# Patient Record
Sex: Female | Born: 1990
Health system: Midwestern US, Community
[De-identification: ages and names within clinical notes are randomized; demographics above are authoritative.]

## PROBLEM LIST (undated history)

## (undated) DIAGNOSIS — B0053 Herpesviral conjunctivitis: Secondary | ICD-10-CM

## (undated) DIAGNOSIS — J45909 Unspecified asthma, uncomplicated: Secondary | ICD-10-CM

## (undated) DIAGNOSIS — O24419 Gestational diabetes mellitus in pregnancy, unspecified control: Secondary | ICD-10-CM

## (undated) HISTORY — DX: Gestational diabetes mellitus in pregnancy, unspecified control: O24.419

## (undated) HISTORY — PX: FRACTURE SURGERY: SHX138

---

## 2014-12-26 ENCOUNTER — Encounter (HOSPITAL_COMMUNITY): Payer: Self-pay | Admitting: *Deleted

## 2014-12-26 ENCOUNTER — Emergency Department (HOSPITAL_COMMUNITY)
Admission: EM | Admit: 2014-12-26 | Discharge: 2014-12-26 | Disposition: A | Discharge status: Home / Self Care | Payer: Medicaid - Out of State | Attending: Emergency Medicine | Admitting: Emergency Medicine

## 2014-12-26 DIAGNOSIS — R21 Rash and other nonspecific skin eruption: Secondary | ICD-10-CM | POA: Diagnosis present

## 2014-12-26 DIAGNOSIS — B009 Herpesviral infection, unspecified: Secondary | ICD-10-CM

## 2014-12-26 DIAGNOSIS — J45909 Unspecified asthma, uncomplicated: Secondary | ICD-10-CM | POA: Diagnosis not present

## 2014-12-26 DIAGNOSIS — B0239 Other herpes zoster eye disease: Secondary | ICD-10-CM | POA: Diagnosis not present

## 2014-12-26 HISTORY — DX: Unspecified asthma, uncomplicated: J45.909

## 2014-12-26 MED ORDER — VALACYCLOVIR HCL 1 G PO TABS: 1000.0000 mg | ORAL_TABLET | Freq: Three times a day (TID) | ORAL | Status: AC

## 2014-12-26 MED ORDER — VALACYCLOVIR HCL 500 MG PO TABS
1000.0000 mg | ORAL_TABLET | Freq: Once | ORAL | Status: AC
  Administered 2014-12-26: 1000 mg via ORAL
  Filled 2014-12-26: qty 2

## 2014-12-26 MED ORDER — FLUORESCEIN SODIUM 1 MG OP STRP
ORAL_STRIP | OPHTHALMIC | Status: AC
  Filled 2014-12-26: qty 1

## 2014-12-26 MED ORDER — FLUORESCEIN SODIUM 1 MG OP STRP
1.0000 | ORAL_STRIP | Freq: Once | OPHTHALMIC | Status: AC
  Administered 2014-12-26: 1 via OPHTHALMIC
  Filled 2014-12-26: qty 1

## 2014-12-26 NOTE — ED Notes (Signed)
Patient with rash to left eye, history of intermittent episodes of rash to eye. Patient states that she thinks she has herpes to the area. Patient reports blurred vision to left eye during episodes of rash to eye. No fevers. Patient has never seen provider for these symptoms. Patient states she never has rash in any other areas. Patient instructed note to wear contacts for one week and to follow up with optho as soon as possible. Patient verbalized importance of follow up.

## 2014-12-26 NOTE — Discharge Instructions (Signed)
Herpes Simplex Virus Follow-up with ophthalmology tomorrow. Return for any vision changes, eye pain, or spread of rash. Herpes simplex virus is a viral infection that may infect many different areas of the body, such as the genitalia and mouth. There are two different strains of the virus: herpes simplex virus 1 (HSV-1) and herpes simplex virus 2 (HSV-2). HSV-1 is typically associated with infections of the mouth and lips. HSV-2 is associated with infections of the genitals. However, either strain of the virus may infect any area. HSV may be spread through saliva particles or sexual contact. One unusual form of HSV-1, known as herpes gladiatorum, is passed from skin-to-skin contact, such as in wrestling. SYMPTOMS   Sometimes, no symptoms.  Fever.  Headache.  Muscle aches.  Tingling.  Itching.  Tenderness.  Genital burning feeling.  Genital pain.  Pain with urination.  Pain with sexual intercourse.  Small blisters in the affected areas. RISK FACTORS   Kissing an infected person.  Sharing eating utensils with an infected person.  Unprotected sexual activity.  Multiple sexual partners.  Direct contact sports without protective clothing.  Contact with an exposed herpes sore.  Stress, illness, and cold increase the risk of recurrence. PROGNOSIS  The primary outbreak of an HSV infection usually lasts 2 to 3 weeks. However, it has been known to last up to 6 weeks. After the primary outbreak subsides, the virus goes into a stage known as latency. During this time, there may be no physical symptoms of infection. After a period of time, some event, such as stress, cold, or illness will trigger another outbreak. This cycle of latency and outbreak may continue indefinitely. The outbreaks usually become milder over time. The body cannot rid itself of HSV. RELATED COMPLICATIONS   Recurrence.  Infection in other areas of the body, such as the eye (ocular herpetic infection,  keratitis) and rarely the brain (herpetic encephalitis). TREATMENT  Many HSV infections can be treated without medicine. During an outbreak, avoid touching the sores. Ice may be used to dull the pain and suppress the virus. Exposure to the sun is a common trigger for an outbreak, so the use of sunscreen may help in such cases. Avoid sexual contact during outbreaks. During the latent periods, it is advised that you use latex condoms, which will reduce the likelihood of spreading the virus to another person. Condoms made from animal products do not protect against HSV. Female condoms cover a larger area than female condoms, and may offer the most protection from the transmission of HSV. The presence of HSV will not affect a condom's ability to protect against pregnancy. Only take medicines for pain and discomfort if directed to do so by your caregiver. Many claims exist that certain dietary changes will prevent an outbreak, but these claims have not been proven. These claims include eating foods that are high in L-lysine and low in arginine (i.e. yogurt, beets, apples, pears, mangoes, oily fish (such as salmon, haddock, snapper, and swordfish), soybean sprouts, chicken, and tomatoes).  Athletes may return to play once they are showing no symptoms, and they have been treated.    This information is not intended to replace advice given to you by your health care provider. Make sure you discuss any questions you have with your health care provider.   Document Released: 01/13/2005 Document Revised: 04/07/2011 Document Reviewed: 08/02/2014 Elsevier Interactive Patient Education [employer]2016 Elsevier Inc.

## 2014-12-26 NOTE — ED Provider Notes (Signed)
CSN: [id number]     Arrival date & time 12/26/14  1458 History  By signing my name below, I, Emmanuella Mensah, attest that this documentation has been prepared under the direction and in the presence of [employer], PA-C. Electronically Signed: Angelene Giovanni, ED Scribe. 12/26/2014. 4:38 PM.    Chief Complaint  Patient presents with  . Rash  . Eye Problem   The history is provided by the patient. No language interpreter was used.   HPI Comments: Michelle [name] is a 24 y.o. female who presents to the Emergency Department complaining of a gradually worsening constant moderate left eye pain and swelling onset 3 days ago. She reports associated photophobia, intermittent blurred vision, and constant HA. She denies any fever or drainage from the eye. She states that she currently wears contact. She reports that her mother told her that her symptoms were consistent with herpes when she was younger. She adds that she has been having these symptoms intermittently since she was younger but does not remember the last time she was treated. No alleviating factors noted.  She denies any fever.   Past Medical History  Diagnosis Date  . Asthma    No past surgical history on file. No family history on file. Social History  Substance Use Topics  . Smoking status: Never Smoker   . Smokeless tobacco: Not on file  . Alcohol Use: Not on file   OB History    No data available     Review of Systems  Constitutional: Negative for fever.  Eyes: Positive for photophobia, pain and visual disturbance (intermittent blurred vision). Negative for discharge.  Neurological: Positive for headaches.      Allergies  Review of patient's allergies indicates not on file.  Home Medications   Prior to Admission medications   Medication Sig Start Date End Date Taking? Authorizing Provider  valACYclovir (VALTREX) 1000 MG tablet Take 1 tablet (1,000 mg total) by mouth 3 (three) times daily. 12/26/14  01/09/15  Kimra Kantor Patel-Mills, PA-C   BP 98/73 mmHg  Pulse 87  Temp(Src) 97.9 F (36.6 C) (Oral)  Resp 16  Ht  (1.626 m)  Wt 56.745 kg  BMI 21.46 kg/m2  SpO2 100% Physical Exam  Constitutional: She is oriented to person, place, and time. She appears well-developed and well-nourished. No distress.  HENT:  Head: Normocephalic and atraumatic.  Eyes: Conjunctivae and EOM are normal. Pupils are equal, round, and reactive to light.  Tender vasicular, erythematous lesions in a dermatomal pattern along the left eyelid and below the left eye.  EOMs intact PERRL Wearing contacts, no drainage from the eye or crusting from the eye lashes.   Wood's lamp with Fluorescein stain of left eye: No dendrites or foreign bodies. No Seidel sign. Normal conjunctiva.  Neck: Neck supple. No tracheal deviation present.  Cardiovascular: Normal rate.   Pulmonary/Chest: Effort normal. No respiratory distress.  Musculoskeletal: Normal range of motion.  Neurological: She is alert and oriented to person, place, and time.  Skin: Skin is warm and dry.  Psychiatric: She has a normal mood and affect. Her behavior is normal.  Nursing note and vitals reviewed.   ED Course  Procedures (including critical care time) DIAGNOSTIC STUDIES: Oxygen Saturation is 98% on RA, normal by my interpretation.    COORDINATION OF CARE: 4:25 PM- Pt advised of plan for treatment and pt agrees. Will consult with attending, Zadie Rhine, MD and pt will be placed on an Antiviral, Valacyclovir. Will also provide resources for a  follow up with Opthalmology. Advised to not wear contacts for one week until rash resolves and she has been seen by ophthalmology.     Visual Acuity  Right Eye Distance: 20/20 Left Eye Distance: 20/25 Bilateral Distance: 20/20  Right Eye Near:   Left Eye Near:    Bilateral Near:     MDM   Final diagnoses:  Herpes  Patient presents for rash over left eyelid. Her exam is concerning for herpes.  Her vital signs are stable and she is well-appearing. I believe that this does not involve her optic nerve. I did a fluorescein stain to look for dendrites but was negative. I spoke to Dr. Bebe ShaggyWickline who is seen and evaluated the patient and agrees with the plan. Patient was put on valacyclovir. I discussed following up with ophthalmology and gave her referral. Return precautions were thoroughly discussed. Patient verbally agrees with the plan.    Filed Vitals:   12/26/14 1521 12/26/14 1704  BP: 110/63 98/73  Pulse: 89 87  Temp: 98.7 F (37.1 C) 97.9 F (36.6 C)  Resp: 16 16   I personally performed the services described in this documentation, which was scribed in my presence. The recorded information has been reviewed and is accurate.   Catha GosselinHanna Patel-Mills, PA-C 12/26/14 1911  Zadie Rhineonald Wickline, MD 12/28/14 1125

## 2015-06-18 ENCOUNTER — Emergency Department (HOSPITAL_COMMUNITY): Payer: Medicaid - Out of State

## 2015-06-18 ENCOUNTER — Encounter (HOSPITAL_COMMUNITY): Payer: Self-pay | Admitting: *Deleted

## 2015-06-18 ENCOUNTER — Emergency Department (HOSPITAL_COMMUNITY)
Admission: EM | Admit: 2015-06-18 | Discharge: 2015-06-18 | Disposition: A | Discharge status: Home / Self Care | Payer: Medicaid - Out of State | Attending: Emergency Medicine | Admitting: Emergency Medicine

## 2015-06-18 DIAGNOSIS — J45909 Unspecified asthma, uncomplicated: Secondary | ICD-10-CM | POA: Insufficient documentation

## 2015-06-18 DIAGNOSIS — M546 Pain in thoracic spine: Secondary | ICD-10-CM | POA: Insufficient documentation

## 2015-06-18 LAB — URINALYSIS, ROUTINE W REFLEX MICROSCOPIC
BILIRUBIN URINE: NEGATIVE
Glucose, UA: NEGATIVE mg/dL
KETONES UR: 15 mg/dL — AB
LEUKOCYTES UA: NEGATIVE
NITRITE: NEGATIVE
Protein, ur: NEGATIVE mg/dL
Specific Gravity, Urine: 1.031 — ABNORMAL HIGH (ref 1.005–1.030)
pH: 5.5 (ref 5.0–8.0)

## 2015-06-18 LAB — I-STAT CHEM 8, ED
BUN: 7 mg/dL (ref 6–20)
Calcium, Ion: 1.21 mmol/L (ref 1.12–1.23)
Chloride: 102 mmol/L (ref 101–111)
Creatinine, Ser: 0.6 mg/dL (ref 0.44–1.00)
Glucose, Bld: 86 mg/dL (ref 65–99)
HCT: 43 % (ref 36.0–46.0)
Hemoglobin: 14.6 g/dL (ref 12.0–15.0)
Potassium: 3.6 mmol/L (ref 3.5–5.1)
Sodium: 138 mmol/L (ref 135–145)
TCO2: 22 mmol/L (ref 0–100)

## 2015-06-18 LAB — URINE MICROSCOPIC-ADD ON

## 2015-06-18 LAB — POC URINE PREG, ED: Preg Test, Ur: NEGATIVE

## 2015-06-18 LAB — D-DIMER, QUANTITATIVE (NOT AT ARMC): D DIMER QUANT: 0.27 ug{FEU}/mL (ref 0.00–0.50)

## 2015-06-18 MED ORDER — KETOROLAC TROMETHAMINE 60 MG/2ML IM SOLN
30.0000 mg | Freq: Once | INTRAMUSCULAR | Status: AC
  Administered 2015-06-18: 30 mg via INTRAMUSCULAR
  Filled 2015-06-18: qty 2

## 2015-06-18 MED ORDER — HYDROCODONE-ACETAMINOPHEN 5-325 MG PO TABS: 1.0000 | ORAL_TABLET | ORAL | Status: DC | PRN

## 2015-06-18 MED ORDER — METHOCARBAMOL 500 MG PO TABS: 500.0000 mg | ORAL_TABLET | Freq: Two times a day (BID) | ORAL | Status: DC

## 2015-06-18 MED ORDER — ACETAMINOPHEN 325 MG PO TABS
650.0000 mg | ORAL_TABLET | Freq: Once | ORAL | Status: DC
  Filled 2015-06-18: qty 2

## 2015-06-18 MED ORDER — FENTANYL CITRATE (PF) 100 MCG/2ML IJ SOLN
50.0000 ug | Freq: Once | INTRAMUSCULAR | Status: AC
  Administered 2015-06-18: 50 ug via INTRAVENOUS
  Filled 2015-06-18: qty 2

## 2015-06-18 MED ORDER — SODIUM CHLORIDE 0.9 % IV BOLUS (SEPSIS)
1000.0000 mL | Freq: Once | INTRAVENOUS | Status: AC
  Administered 2015-06-18: 1000 mL via INTRAVENOUS

## 2015-06-18 NOTE — ED Notes (Signed)
Pt reports left side back pain under her shoulder, reports its a stabbing pain that radiates around to her side. Denies any urinary symptoms or recent cough. No acute distress noted.

## 2015-06-18 NOTE — Discharge Instructions (Signed)
Take medications as prescribed. Return to the emergency room for worsening condition or new concerning symptoms. Follow up with your regular doctor. If you don't have a regular doctor use one of the numbers below to establish a primary care doctor. ° ° °Emergency Department Resource Guide °1) Find a Doctor and Pay Out of Pocket °Although you won't have to find out who is covered by your insurance plan, it is a good idea to ask around and get recommendations. You will then need to call the office and see if the doctor you have chosen will accept you as a new patient and what types of options they offer for patients who are self-pay. Some doctors offer discounts or will set up payment plans for their patients who do not have insurance, but you will need to ask so you aren't surprised when you get to your appointment. ° °2) Contact Your Local Health Department °Not all health departments have doctors that can see patients for sick visits, but many do, so it is worth a call to see if yours does. If you don't know where your local health department is, you can check in your phone book. The CDC also has a tool to help you locate your state's health department, and many state websites also have listings of all of their local health departments. ° °3) Find a Walk-in Clinic °If your illness is not likely to be very severe or complicated, you may want to try a walk in clinic. These are popping up all over the country in pharmacies, drugstores, and shopping centers. They're usually staffed by nurse practitioners or physician assistants that have been trained to treat common illnesses and complaints. They're usually fairly quick and inexpensive. However, if you have serious medical issues or chronic medical problems, these are probably not your best option. ° °No Primary Care Doctor: °- Call [facility] at  832-8000 - they can help you locate a primary care doctor that  accepts your insurance, provides certain services,  etc. °- Physician Referral Service- 1-800-533-3463 ° °Emergency Department Resource Guide °1) Find a Doctor and Pay Out of Pocket °Although you won't have to find out who is covered by your insurance plan, it is a good idea to ask around and get recommendations. You will then need to call the office and see if the doctor you have chosen will accept you as a new patient and what types of options they offer for patients who are self-pay. Some doctors offer discounts or will set up payment plans for their patients who do not have insurance, but you will need to ask so you aren't surprised when you get to your appointment. ° °2) Contact Your Local Health Department °Not all health departments have doctors that can see patients for sick visits, but many do, so it is worth a call to see if yours does. If you don't know where your local health department is, you can check in your phone book. The CDC also has a tool to help you locate your state's health department, and many state websites also have listings of all of their local health departments. ° °3) Find a Walk-in Clinic °If your illness is not likely to be very severe or complicated, you may want to try a walk in clinic. These are popping up all over the country in pharmacies, drugstores, and shopping centers. They're usually staffed by nurse practitioners or physician assistants that have been trained to treat common illnesses and complaints. They're usually fairly   quick and inexpensive. However, if you have serious medical issues or chronic medical problems, these are probably not your best option. ° °No Primary Care Doctor: °- Call [facility] at  832-8000 - they can help you locate a primary care doctor that  accepts your insurance, provides certain services, etc. °- Physician Referral Service- 1-800-533-3463 ° °Chronic Pain Problems: °Organization         Address  Phone   Notes  °[facility]  ([phone]) [phone] Patients need to be referred by  their primary care doctor.  ° °Medication Assistance: °Organization         Address  Phone   Notes  °[place] [facility] [address] °[place], [place] [address] ([phone]) 641-8030 --Must be a resident of [place] °-- Must have NO insurance coverage whatsoever (no Medicaid/ Medicare, etc.) °-- The pt. MUST have a primary care doctor that directs their care regularly and follows them in the community °  °[facility]  (866) 331-1348   °[employer]  (888) 892-1162   ° °Agencies that provide inexpensive medical care: °Organization         Address  Phone   Notes  °[facility]  ([phone]) [phone]   °[facility]    (336) 832-7272   °[facility] [address] °[place], [place] [address] (336) [phone]   °[facility] [address], °[place] (336) [phone]   °[facility]    (336) 373-0678   °[facility]    (336) 272-1050   °[facility] ° [address], [place] Phone:  (336) [phone], Fax:  (336) [phone] Hours of Operation:  9 am - 6 pm, M-F.  Also accepts Medicaid/Medicare and self-pay.  °[facility] ° [address], [place] Phone: (336) [phone], Fax: ([phone]) [phone]. Hours of Operation:  8:30 am - 5:30 pm, M-F.  Also accepts Medicaid and self-pay.  °[facility] [place] [address], [place] Phone: (336) [phone]   °[facility] [address], [place], [place] (336)723-1848, Ext. 123 Mondays & Thursdays: 7-9 AM.  First 15 patients are seen on a first come, first serve basis. °  ° °Medicaid-accepting [place] Providers: ° °Organization         Address  Phone   Notes  °[facility] [address], [place] ([phone]) [phone] Also accepts self-pay patients.  °[facility] [address], [place] ° (336) [phone]   °[facility]  [address], [place] (336) [phone]   °[facility] [address] [place] [address], [place] (336) [phone]   °[facility] [address], [place]  ° ([phone]) [phone] Only accepts [place] Access Medicaid patients after they have their name applied to their card.  ° °Self-Pay (no insurance) in [place]: ° °Organization         Address  Phone   Notes  °Sickle Cell Patients, [facility] [address], [place] (336) [phone]   °[facility] [address], [place] (336) [phone]   °[facility] ° [address], [place] (336) [phone]   °[facility]/Dr. Osei-Bonsu ° [address] [place] [address], [place] or [address], [place] [phone]) [phone] Phone number   for both [place] and [place] locations is the same.  °[facility] [address], [place] ([phone]) [phone]   °[facility] [address] [place] [address], [place] or [address] ([phone]) [phone] °(336) [phone]   °[facility] [address],  (336) 350-1642, phone; ([phone]) [phone], fax Sees patients 1st and 3rd Saturday of every month.  Must not qualify for public or private insurance (i.e. Medicaid, Medicare, [place] Health Choice, Veterans' Benefits) • Household income should be no more than 200% of the poverty level •The clinic cannot treat you if you are pregnant or think you are pregnant • Sexually transmitted diseases are not treated at the clinic.  ° ° ° °

## 2015-06-18 NOTE — ED Provider Notes (Signed)
CSN: [id number]     Arrival date & time 06/18/15  1211 History  By signing my name below, I, Octavia Heirrianna Nassar, attest that this documentation has been prepared under the direction and in the presence of Merilee Wible, [place]-C. Electronically Signed: Octavia HeirArianna Nassar, ED Scribe. 06/18/2015. 1:15 PM.    Chief Complaint  Patient presents with  . Back Pain  . Flank Pain      The history is provided by the patient.   HPI Comments: Michelle [name] is a 25 y.o. female who presents to the Emergency Department complaining of sudden onset, gradual worsening, constant, left sided upper back pain onset last night. She says the pain radiates around to the left side of her lower left chest. She notes she is feeling short of breath due to the increased pain. Pt says she has taken ibuprofen and tylenol to alleviate her pain with no relief. Denies nausea, vomiting, chest pain, urinary symptoms, hx of blood control. Pt is a smoker.   Past Medical History  Diagnosis Date  . Asthma    History reviewed. No pertinent past surgical history. History reviewed. No pertinent family history. Social History  Substance Use Topics  . Smoking status: Never Smoker   . Smokeless tobacco: None  . Alcohol Use: None   OB History    No data available     Review of Systems  A complete 10 system review of systems was obtained and all systems are negative except as noted in the HPI and PMH.    Allergies  Review of patient's allergies indicates no known allergies.  Home Medications   Prior to Admission medications   Not on File   Triage vitals: BP 126/75 mmHg  Pulse 110  Temp(Src) 98.6 F (37 C) (Oral)  Resp 20  SpO2 99%  LMP 05/28/2015 Physical Exam  Constitutional: She is oriented to person, place, and time. She appears well-developed and well-nourished.  HENT:  Head: Normocephalic.  Eyes: EOM are normal.  Neck: Normal range of motion.  Cardiovascular: Normal rate and regular rhythm.   Pulmonary/Chest: Effort  normal.  Heavy breathing due to pain. No tachypnea. Lungs CTAB. Anterior chest nontender.  Abdominal: Soft. Bowel sounds are normal. She exhibits no distension. There is no tenderness.  No CVA tenderness  Musculoskeletal: Normal range of motion.       Back:  Left thoracic area with diffuse ttp  Neurological: She is alert and oriented to person, place, and time.  Psychiatric: She has a normal mood and affect.  Nursing note and vitals reviewed.   ED Course  Procedures  DIAGNOSTIC STUDIES: Oxygen Saturation is 99% on RA, normal by my interpretation.  COORDINATION OF CARE:  1:15 PM Discussed treatment plan with pt at bedside and pt agreed to plan.  Labs Review Labs Reviewed  URINALYSIS, ROUTINE W REFLEX MICROSCOPIC (NOT AT [facility]) - Abnormal; Notable for the following:    APPearance HAZY (*)    Specific Gravity, Urine 1.031 (*)    Hgb urine dipstick MODERATE (*)    Ketones, ur 15 (*)    All other components within normal limits  URINE MICROSCOPIC-ADD ON - Abnormal; Notable for the following:    Squamous Epithelial / LPF 0-5 (*)    Bacteria, UA FEW (*)    All other components within normal limits  D-DIMER, QUANTITATIVE (NOT AT [facility])  POC URINE PREG, ED  I-STAT CHEM 8, ED    Imaging Review No results found. I have personally reviewed and evaluated these images  and lab results as part of my medical decision-making.   EKG Interpretation None      MDM   Final diagnoses:  Left-sided thoracic back pain    Pt with left thoracic back pain that radiates to underneath her left breast. She does have ttp inferior to her left scapula. However given her report of SOB and several risk factors along with tachycardia in the ED I will obtain d-dimer to r/o PE. UA obtained in triage. She denies urinary stymptoms but UA does reveal moderate hgb and ketones. Will hydrate with 1L NS and check chem 8 to check kidney function. She states she is not on her period, does not have sig history of  UTI or kidney stones. She has no CVA tenderness and her pain is higher up in her thorax so will hold off on CT for now.  Pain resolved with fluids and IV pain meds. Chem 8 is NL. D-dimer negative. I suspect muscular strain/spasm. Pt is a Horticulturist, commercial. Will give rx for norco and robaxin. She has NSAIDs at home. Instructed close pcp f/u. ER return precautions given.  I personally performed the services described in this documentation, which was scribed in my presence. The recorded information has been reviewed and is accurate.   Carlene Coria, PA-C 06/18/15 1540  Pricilla Loveless, MD 06/19/15 [phone]

## 2016-08-26 ENCOUNTER — Encounter (HOSPITAL_COMMUNITY): Payer: Self-pay | Admitting: Emergency Medicine

## 2016-08-26 ENCOUNTER — Emergency Department (HOSPITAL_COMMUNITY)
Admission: EM | Admit: 2016-08-26 | Discharge: 2016-08-26 | Disposition: A | Discharge status: Home / Self Care | Payer: Medicaid - Out of State | Attending: Emergency Medicine | Admitting: Emergency Medicine

## 2016-08-26 DIAGNOSIS — J45909 Unspecified asthma, uncomplicated: Secondary | ICD-10-CM | POA: Insufficient documentation

## 2016-08-26 DIAGNOSIS — B379 Candidiasis, unspecified: Secondary | ICD-10-CM | POA: Insufficient documentation

## 2016-08-26 DIAGNOSIS — B9689 Other specified bacterial agents as the cause of diseases classified elsewhere: Secondary | ICD-10-CM

## 2016-08-26 DIAGNOSIS — N760 Acute vaginitis: Secondary | ICD-10-CM | POA: Insufficient documentation

## 2016-08-26 DIAGNOSIS — Z79899 Other long term (current) drug therapy: Secondary | ICD-10-CM | POA: Insufficient documentation

## 2016-08-26 LAB — WET PREP, GENITAL
Sperm: NONE SEEN
Trich, Wet Prep: NONE SEEN

## 2016-08-26 LAB — URINALYSIS, ROUTINE W REFLEX MICROSCOPIC
Bilirubin Urine: NEGATIVE
Glucose, UA: NEGATIVE mg/dL
Hgb urine dipstick: NEGATIVE
Ketones, ur: NEGATIVE mg/dL
Leukocytes, UA: NEGATIVE
Nitrite: NEGATIVE
Protein, ur: NEGATIVE mg/dL
Specific Gravity, Urine: 1.012 (ref 1.005–1.030)
pH: 8 (ref 5.0–8.0)

## 2016-08-26 MED ORDER — METRONIDAZOLE 500 MG PO TABS: 500.0000 mg | ORAL_TABLET | Freq: Two times a day (BID) | ORAL | 0 refills | Status: DC

## 2016-08-26 MED ORDER — FLUCONAZOLE 150 MG PO TABS: 150.0000 mg | ORAL_TABLET | Freq: Every day | ORAL | 0 refills | Status: DC

## 2016-08-26 MED ORDER — FLUCONAZOLE 100 MG PO TABS
150.0000 mg | ORAL_TABLET | Freq: Every day | ORAL | Status: DC
  Administered 2016-08-26: 150 mg via ORAL
  Filled 2016-08-26: qty 2

## 2016-08-26 NOTE — Discharge Instructions (Signed)
Please read attached information. If you experience any new or worsening signs or symptoms please return to the emergency room for evaluation. Please follow-up with your primary care provider or specialist as discussed. Please use medication prescribed only as directed and discontinue taking if you have any concerning signs or symptoms.   °

## 2016-08-26 NOTE — ED Provider Notes (Signed)
[facility]-EMERGENCY DEPT Provider Note   CSN: [id number] Arrival date & time: 08/26/16  1342  By signing my name below, I, Michelle [name], attest that this documentation has been prepared under the direction and in the presence of H&R BlockJeffrey Mishika Flippen PA-C.  Electronically Signed: Vista Minkobert [name], ED Scribe. 08/26/16. 4:43 PM.   History   Chief Complaint Chief Complaint  Patient presents with  . Vaginitis    HPI  HPI Comments: Michelle [name] is a 26 y.o. female who presents to the Emergency Department complaining of persistent vaginal itching/irritation, dryness with associated vaginal discharge that started one week ago. Pt reports thick, white vaginal discharge. Pt notes that she has a new sexual partner and has not used protection. Pt is not taking birth control. She denies any chance that she could be pregnant. No fever, chills, nausea, vomiting. No abdominal pain.   The history is provided by the patient. No language interpreter was used.    Past Medical History:  Diagnosis Date  . Asthma     There are no active problems to display for this patient.   History reviewed. No pertinent surgical history.  OB History    No data available       Home Medications    Prior to Admission medications   Medication Sig Start Date End Date Taking? Authorizing Provider  fluconazole (DIFLUCAN) 150 MG tablet Take 1 tablet (150 mg total) by mouth daily. 08/26/16   Chavez Rosol, Tinnie GensJeffrey, PA-C  HYDROcodone-acetaminophen (NORCO/VICODIN) 5-325 MG tablet Take 1 tablet by mouth every 4 (four) hours as needed. 06/18/15   Sam, Ace GinsSerena Y, PA-C  methocarbamol (ROBAXIN) 500 MG tablet Take 1 tablet (500 mg total) by mouth 2 (two) times daily. 06/18/15   Sam, Ace GinsSerena Y, PA-C  metroNIDAZOLE (FLAGYL) 500 MG tablet Take 1 tablet (500 mg total) by mouth 2 (two) times daily. 08/26/16   Eyvonne MechanicHedges, Aisea Bouldin, PA-C    Family History No family history on file.  Social History Social History  Substance Use Topics  . Smoking  status: Never Smoker  . Smokeless tobacco: Never Used  . Alcohol use Yes     Allergies   Patient has no known allergies.   Review of Systems Review of Systems  Constitutional: Negative for chills and fever.  Gastrointestinal: Negative for abdominal pain, nausea and vomiting.  Genitourinary: Positive for vaginal discharge and vaginal pain.       Itching    Physical Exam Updated Vital Signs BP 109/62 (BP Location: Right Arm)   Pulse 86   Temp 98.3 F (36.8 C) (Oral)   Resp 16   Wt 127 lb (57.6 kg)   LMP 07/27/2016 (Approximate)   SpO2 100%   BMI 21.80 kg/m   Physical Exam  Constitutional: She is oriented to person, place, and time. She appears well-developed and well-nourished. No distress.  HENT:  Head: Normocephalic and atraumatic.  Neck: Normal range of motion.  Pulmonary/Chest: Effort normal.  Abdominal: Soft. She exhibits no distension. There is no tenderness.  Genitourinary:  Genitourinary Comments: No rash, purulent discharge, no vaginal bleeding, no cervical or adnexal tenderness  Neurological: She is alert and oriented to person, place, and time.  Skin: Skin is warm and dry. She is not diaphoretic.  Psychiatric: She has a normal mood and affect. Judgment normal.  Nursing note and vitals reviewed.  ED Treatments / Results  DIAGNOSTIC STUDIES: Oxygen Saturation is 100% on RA, normal by my interpretation.  COORDINATION OF CARE: 4:43 PM-Discussed treatment plan with pt at bedside and  pt agreed to plan.   Labs (all labs ordered are listed, but only abnormal results are displayed) Labs Reviewed  WET PREP, GENITAL - Abnormal; Notable for the following:       Result Value   Yeast Wet Prep HPF POC PRESENT (*)    Clue Cells Wet Prep HPF POC PRESENT (*)    WBC, Wet Prep HPF POC MODERATE (*)    All other components within normal limits  URINALYSIS, ROUTINE W REFLEX MICROSCOPIC - Abnormal; Notable for the following:    Color, Urine STRAW (*)    All other  components within normal limits  POC URINE PREG, ED  GC/CHLAMYDIA PROBE AMP ([place]) NOT AT [facility]    EKG  EKG Interpretation None       Radiology No results found.  Procedures Procedures (including critical care time)  Medications Ordered in ED Medications  fluconazole (DIFLUCAN) tablet 150 mg (not administered)     Initial Impression / Assessment and Plan / ED Course  I have reviewed the triage vital signs and the nursing notes.  Pertinent labs & imaging results that were available during my care of the patient were reviewed by me and considered in my medical decision making (see chart for details).      Final Clinical Impressions(s) / ED Diagnoses   Final diagnoses:  Acute vaginitis  Bacterial vaginosis  Yeast infection    26 year old female presents today with vaginitis.  Patient has bacterial vaginosis, yeast on wet prep here.  I have concern for other STDs.  She will be treated prophylactically after discussion with her.  Patient has no cervical motion tenderness or any other concerning signs or symptoms.  She will be treated, will follow up as an outpatient and return as needed.  She verbalized understanding and agreement to today's plan.  New Prescriptions New Prescriptions   FLUCONAZOLE (DIFLUCAN) 150 MG TABLET    Take 1 tablet (150 mg total) by mouth daily.   METRONIDAZOLE (FLAGYL) 500 MG TABLET    Take 1 tablet (500 mg total) by mouth 2 (two) times daily.   I personally performed the services described in this documentation, which was scribed in my presence. The recorded information has been reviewed and is accurate.   Eyvonne MechanicHedges, Asier Desroches, PA-C 08/26/16 1754    Mancel BaleWentz, Elliott, MD 08/28/16 [phone]

## 2016-08-26 NOTE — ED Notes (Signed)
Pelvic exam done by Jeff - PA and Conrado Nance - EMT assisted. 

## 2016-08-26 NOTE — ED Notes (Signed)
Pt given cranberry juice, per Trey PaulaJeff - PA.

## 2016-08-26 NOTE — ED Notes (Signed)
Pt used the bathroom before a urine sample could be collected. Informed Kendal HymenBonnie - RN.

## 2016-08-26 NOTE — ED Triage Notes (Signed)
Pt with vaginal itching, dryness, pain and redness. Some discharge that is white and thick. No fever. NAD.

## 2016-08-29 LAB — GC/CHLAMYDIA PROBE AMP (~~LOC~~) NOT AT ARMC
Chlamydia: NEGATIVE
Neisseria Gonorrhea: NEGATIVE

## 2017-03-28 IMAGING — CR DG RIBS W/ CHEST 3+V*L*
3 series · 3 of 3 positions shown · non-contrast
Comparison: None.

CLINICAL DATA: Posterior left rib pain and shortness of breath for
2 days. No acute injury.

EXAM:
LEFT RIBS AND CHEST - 3+ VIEW

[chest pa]
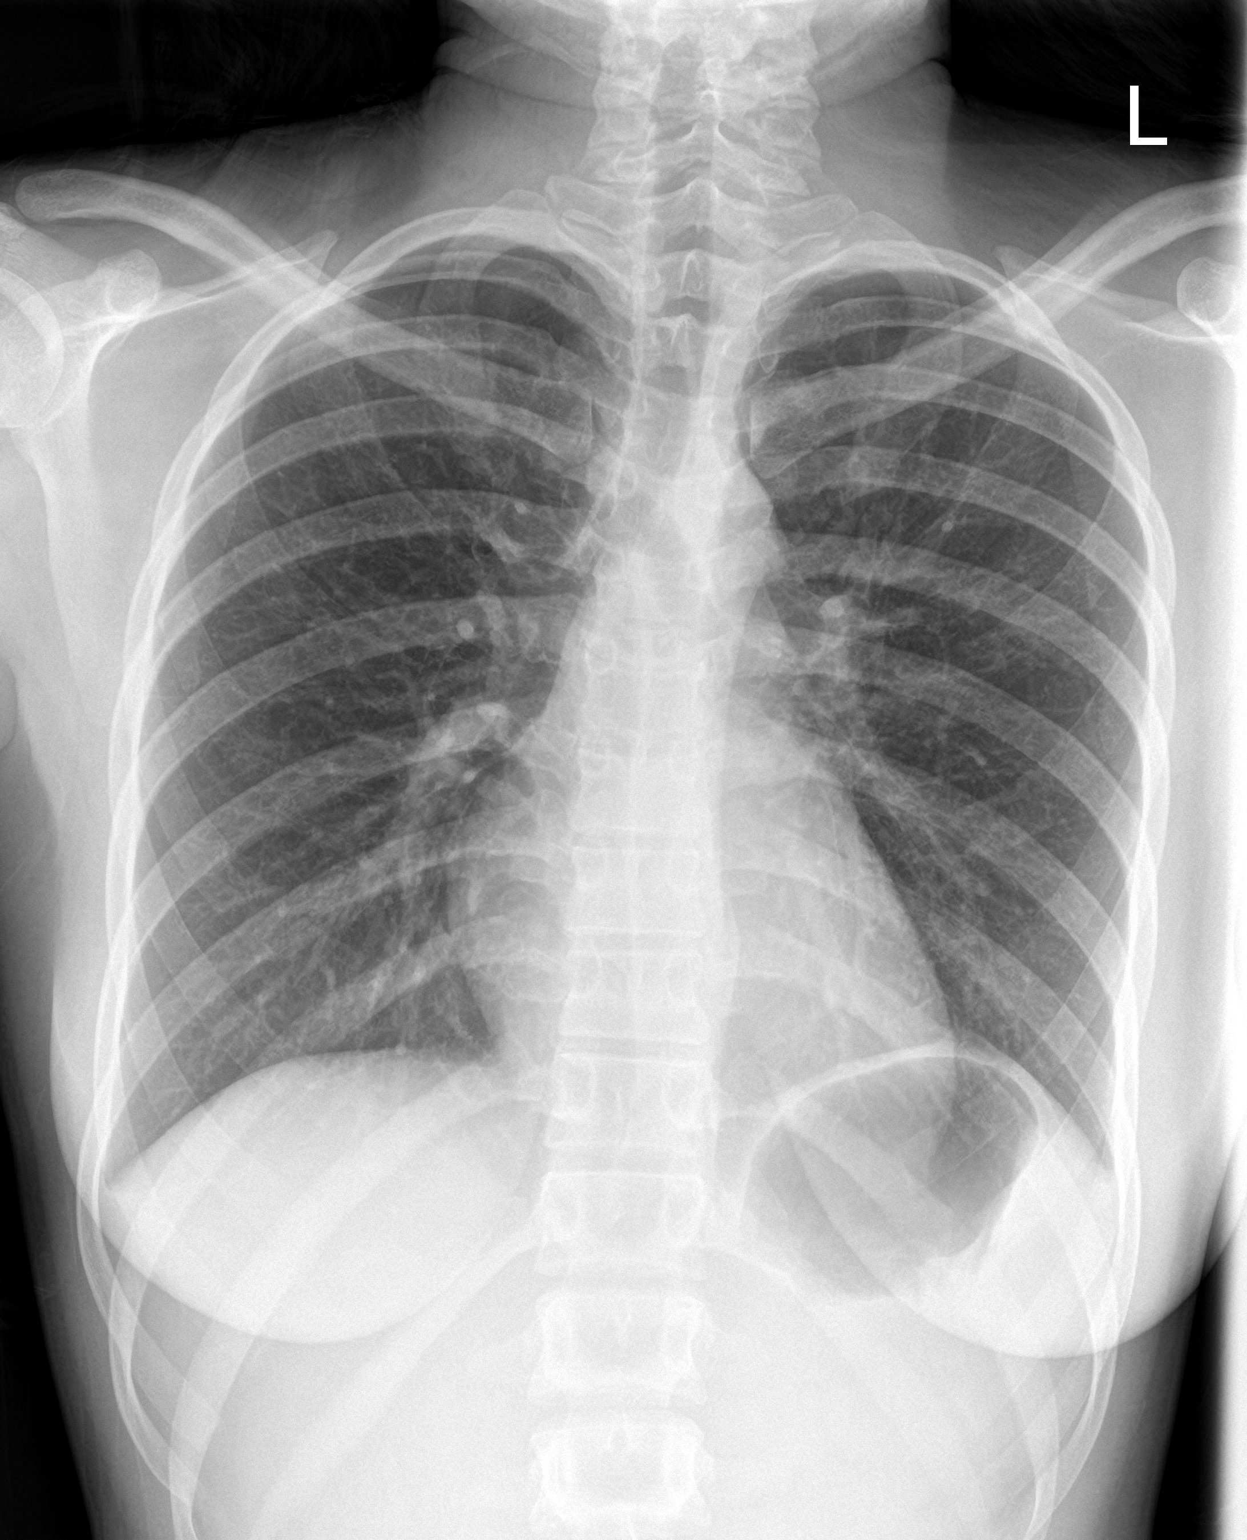

[rib ap]
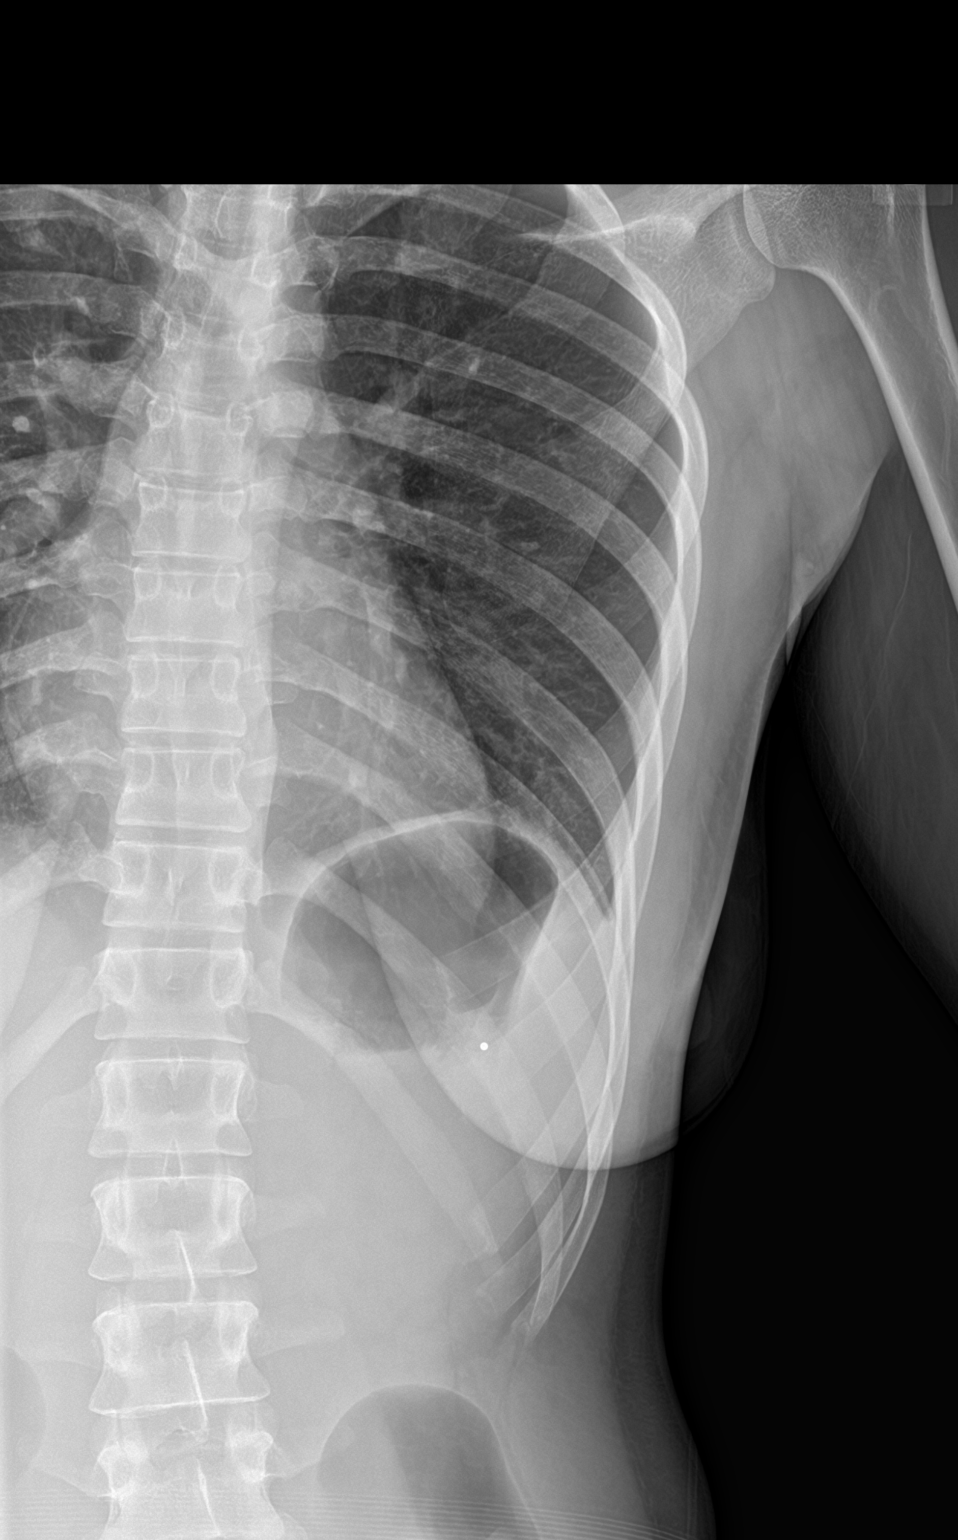

[rib ap obl]
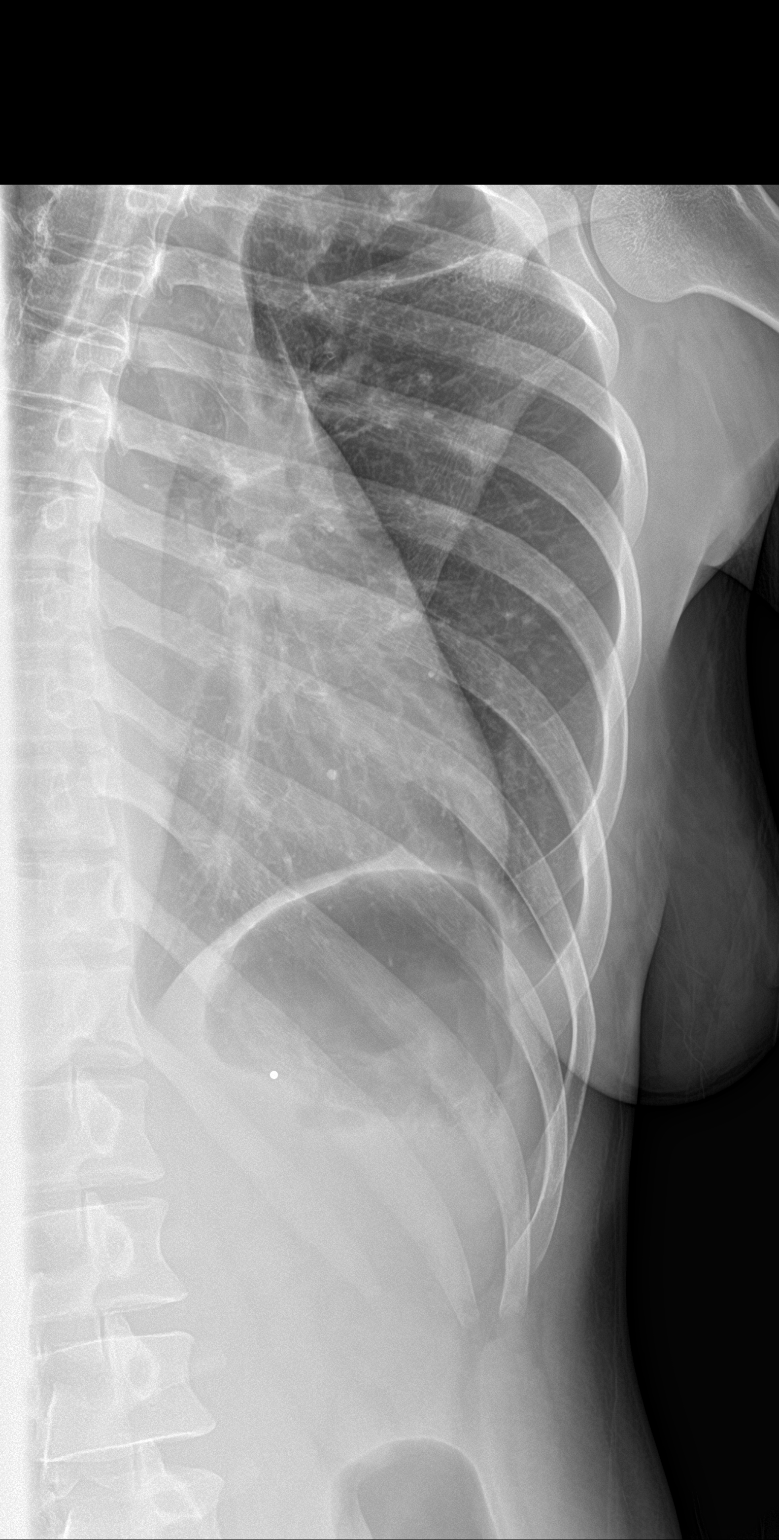

[3 of 3 positions shown; findings below may reference images not displayed]

FINDINGS: The heart size and mediastinal contours are normal. The lungs are
clear. There is no pleural effusion or pneumothorax. No acute
osseous findings are identified. A BB was placed over the area of
pain along the posterior inferior left chest wall. No underlying rib
lesion or fracture identified.
IMPRESSION: Negative. No evidence of rib fracture or active cardiopulmonary
process.

## 2018-10-03 ENCOUNTER — Encounter (HOSPITAL_COMMUNITY): Payer: Self-pay | Admitting: Emergency Medicine

## 2018-10-03 ENCOUNTER — Other Ambulatory Visit: Payer: Self-pay

## 2018-10-03 ENCOUNTER — Emergency Department (HOSPITAL_COMMUNITY)
Admission: EM | Admit: 2018-10-03 | Discharge: 2018-10-04 | Disposition: A | Discharge status: Home / Self Care | Payer: Medicaid - Out of State | Attending: Emergency Medicine | Admitting: Emergency Medicine

## 2018-10-03 DIAGNOSIS — B373 Candidiasis of vulva and vagina: Secondary | ICD-10-CM | POA: Insufficient documentation

## 2018-10-03 DIAGNOSIS — B9689 Other specified bacterial agents as the cause of diseases classified elsewhere: Secondary | ICD-10-CM | POA: Diagnosis not present

## 2018-10-03 DIAGNOSIS — N898 Other specified noninflammatory disorders of vagina: Secondary | ICD-10-CM | POA: Diagnosis present

## 2018-10-03 DIAGNOSIS — J45909 Unspecified asthma, uncomplicated: Secondary | ICD-10-CM | POA: Insufficient documentation

## 2018-10-03 DIAGNOSIS — N760 Acute vaginitis: Secondary | ICD-10-CM | POA: Diagnosis not present

## 2018-10-03 DIAGNOSIS — B3731 Acute candidiasis of vulva and vagina: Secondary | ICD-10-CM

## 2018-10-03 LAB — I-STAT BETA HCG BLOOD, ED (MC, WL, AP ONLY): I-stat hCG, quantitative: <5 m[IU]/mL (ref <5)

## 2018-10-03 NOTE — ED Triage Notes (Signed)
Pt to ED with c/o vaginal discharge and itching x's 3 days

## 2018-10-04 LAB — URINALYSIS, ROUTINE W REFLEX MICROSCOPIC
Bacteria, UA: NONE SEEN
Bilirubin Urine: NEGATIVE
Glucose, UA: NEGATIVE mg/dL
Ketones, ur: 80 mg/dL — AB
Nitrite: NEGATIVE
Protein, ur: 30 mg/dL — AB
Specific Gravity, Urine: 1.033 — ABNORMAL HIGH (ref 1.005–1.030)
pH: 5 (ref 5.0–8.0)

## 2018-10-04 LAB — RPR: RPR Ser Ql: NONREACTIVE

## 2018-10-04 LAB — WET PREP, GENITAL
Sperm: NONE SEEN
Trich, Wet Prep: NONE SEEN

## 2018-10-04 MED ORDER — METRONIDAZOLE 500 MG PO TABS: 500.0000 mg | ORAL_TABLET | Freq: Two times a day (BID) | ORAL | 0 refills | Status: DC

## 2018-10-04 MED ORDER — FLUCONAZOLE 150 MG PO TABS: ORAL_TABLET | ORAL | 0 refills | Status: DC

## 2018-10-04 NOTE — Discharge Instructions (Addendum)
Thank you for allowing me to care for you today in the Emergency Department.   You tested positive today for yeast and bacterial vaginosis on your pelvic exam.  These infections are not considered to be a sexually transmitted infection so you do not need to let sexual partners know that you tested positive.  -To treat bacterial vaginosis, take 1 tablet of metronidazole by mouth 2 times daily for the next week.  It is very important to avoid alcohol while you are taking this medication because it causes severe, projectile vomiting.  -To treat the yeast infection, take 1 tablet of Diflucan.  Unfortunately, you can get a recurrent yeast infection after taking antibiotics.  Since she will also be taking metronidazole for BV, I have given you a second tablet of Diflucan.  Take this in 1 week after you have finished all of the metronidazole tablets.  Your gonorrhea, chlamydia, HIV, and syphilis test are pending.  If you test positive, you will receive a call from the hospital with a number you gave registration today.  If you test positive for any of these test, it is your responsibility to let all sexual partners know that you tested positive so they can also seek testing and treatment.  You can download the app MyChart on your phone to review the results of all test once they are available.  The instructions are included on your discharge paperwork.  Download the app GoodRx to view the prices for the prescriptions at different pharmacies.  Return to the emergency department if you develop high fevers, severe abdominal pain, worsening vaginal discharge, back pain, persistent vomiting, or other new, concerning symptoms.

## 2018-10-04 NOTE — ED Provider Notes (Signed)
[facility] EMERGENCY DEPARTMENT Provider Note   CSN: [id number] Arrival date & time: 10/03/18  1812     History   Chief Complaint Chief Complaint  Patient presents with  . Vaginal Discharge  . Vaginal Itching    HPI Michelle [name] is a 28 y.o. female with a history of asthma who presents to the emergency department with a chief complaint of vaginal discharge.  The patient endorses thick, green-colored vaginal discharge for the last 3 days accompanied by vaginal itching and dysuria and dyspareunia.  She denies fever, chills, rashes, abdominal pain, nausea, vomiting, diarrhea, constipation, hematuria, urinary frequency or hesitancy, vaginal bleeding, back pain, or flank pain.  She reports that she has been sexually active with 2 individuals in the last few weeks.  She reports that she had oral sex with a female partner several days prior to the onset of symptoms. She does report that he did penetrate his vagina with his hand, but no penetration with his penis. No anal intercourse. No sexual toys used.  They did not use a condom.  She also reports that she was sexually active with a female partner approximately 2 weeks ago.  She reports that they used a strap-on for vaginal penetration as well as anticipated and oral-vaginal intercourse on both partners. She is unsure if the toy has been used with other partners. No anal intercourse. They did not use any form of barrier protection.    She reports that she took 3 tablets of metronidazole that she was given by her sister with no improvement in her symptoms.  No other treatment prior to arrival. LMP 09/20/18.      The history is provided by the patient. No language interpreter was used.  Vaginal Itching Pertinent negatives include no chest pain, no abdominal pain, no headaches and no shortness of breath.    Past Medical History:  Diagnosis Date  . Asthma     There are no active problems to display for this patient.    Past Surgical History:  Procedure Laterality Date  . FRACTURE SURGERY       OB History   No obstetric history on file.      Home Medications    Prior to Admission medications   Medication Sig Start Date End Date Taking? Authorizing Provider  fluconazole (DIFLUCAN) 150 MG tablet Take 1 tablet of Diflucan by mouth, then take 1 additional tablet of Diflucan by mouth 1 week later. 10/04/18   Chinedu Agustin A, PA-C  metroNIDAZOLE (FLAGYL) 500 MG tablet Take 1 tablet (500 mg total) by mouth 2 (two) times daily. 10/04/18   Marche Hottenstein A, PA-C    Family History No family history on file.  Social History Social History   Tobacco Use  . Smoking status: Never Smoker  . Smokeless tobacco: Never Used  Substance Use Topics  . Alcohol use: Yes  . Drug use: Yes    Types: Marijuana     Allergies   Patient has no known allergies.   Review of Systems Review of Systems  Constitutional: Negative for activity change, chills and fever.  Respiratory: Negative for shortness of breath.   Cardiovascular: Negative for chest pain.  Gastrointestinal: Negative for abdominal pain, anal bleeding, constipation, diarrhea, nausea and vomiting.  Genitourinary: Positive for dysuria, vaginal discharge and vaginal pain. Negative for enuresis, flank pain, genital sores, hematuria, menstrual problem and pelvic pain.  Musculoskeletal: Negative for back pain.  Skin: Negative for rash.  Allergic/Immunologic: Negative for immunocompromised state.  Neurological: Negative for weakness and headaches.  Psychiatric/Behavioral: Negative for confusion.   Physical Exam Updated Vital Signs BP 114/77 (BP Location: Left Arm)   Pulse 62   Temp 98.9 F (37.2 C) (Oral)   Resp 18   Ht 5\' 3"  (1.6 m)   Wt 55.8 kg   LMP 09/20/2018 (Exact Date)   SpO2 100%   BMI 21.79 kg/m   Physical Exam Vitals signs and nursing note reviewed.  Constitutional:      General: She is not in acute distress.    Appearance: She is  not ill-appearing, toxic-appearing or diaphoretic.  HENT:     Head: Normocephalic.  Eyes:     Conjunctiva/sclera: Conjunctivae normal.  Neck:     Musculoskeletal: Neck supple.  Cardiovascular:     Rate and Rhythm: Normal rate and regular rhythm.     Heart sounds: No murmur. No friction rub. No gallop.   Pulmonary:     Effort: Pulmonary effort is normal. No respiratory distress.  Abdominal:     General: There is no distension.     Palpations: Abdomen is soft. There is no mass.     Tenderness: There is no abdominal tenderness. There is no right CVA tenderness, left CVA tenderness, guarding or rebound.     Hernia: No hernia is present.  Genitourinary:    Comments: Chaperoned exam.  No cervical motion tenderness.  No adnexal tenderness or fullness bilaterally.  There is a large amount of thick, chunky white discharge noted in the vaginal vault. Skin:    General: Skin is warm.     Findings: No rash.  Neurological:     Mental Status: She is alert.  Psychiatric:        Behavior: Behavior normal.      ED Treatments / Results  Labs (all labs ordered are listed, but only abnormal results are displayed) Labs Reviewed  WET PREP, GENITAL - Abnormal; Notable for the following components:      Result Value   Yeast Wet Prep HPF POC PRESENT (*)    Clue Cells Wet Prep HPF POC PRESENT (*)    WBC, Wet Prep HPF POC MANY (*)    All other components within normal limits  URINALYSIS, ROUTINE W REFLEX MICROSCOPIC - Abnormal; Notable for the following components:   APPearance HAZY (*)    Specific Gravity, Urine 1.033 (*)    Hgb urine dipstick SMALL (*)    Ketones, ur 80 (*)    Protein, ur 30 (*)    Leukocytes,Ua SMALL (*)    All other components within normal limits  URINE CULTURE  RPR  HIV ANTIBODY (ROUTINE TESTING W REFLEX)  I-STAT BETA HCG BLOOD, ED ([facility], [facility], [facility] ONLY)  GC/CHLAMYDIA PROBE AMP ([place]) NOT AT [facility]    EKG None  Radiology No results found.  Procedures  Procedures (including critical care time)  Medications Ordered in ED Medications - No data to display   Initial Impression / Assessment and Plan / ED Course  I have reviewed the triage vital signs and the nursing notes.  Pertinent labs & imaging results that were available during my care of the patient were reviewed by me and considered in my medical decision making (see chart for details).        28 year old female presenting with vaginal discharge, itching, dyspareunia, and dysuria for the last 3 days.  She treated her symptoms with 3 tablets of metronidazole given to her by a family member prior to arrival.  She  has been sexually active with 2 partners over the last few weeks.  No constitutional symptoms.  Abdominal exam is benign.  She is hemodynamically stable.  Pregnancy test is negative.  Wet prep is positive for yeast and clue cells with many WBCs.  Urinalysis concerning for mild dehydration, but patient is tolerating p.o. fluids.  There is a minimal amount of leukocyte esterase, which could be secondary to vaginal etiology.Will send for urine culture. RPR, HIV, gonorrhea, and Chlamydia test are pending.  Doubt PID, pyelonephritis, or ovarian torsion.  Will discharge with 2 tablets of Diflucan- one to treat current yeast infection and a second tablet to take after she finishes course of metronidazole for BV.  We had a shared decision-making conversation regarding treatment for gonorrhea and chlamydia, which she defers treatment at this time.  Based on her exam, I think this is reasonable.  She is also been given a referral to the walk-in gynecology clinic.  She is also aware of pending labs and has been advised that if these are positive that she needs to let all sexual partners know.  She is hemodynamically stable in no acute distress.  Safe discharge home with outpatient follow-up as needed.  Final Clinical Impressions(s) / ED Diagnoses   Final diagnoses:  BV (bacterial vaginosis)   Vulvovaginal candidiasis    ED Discharge Orders         Ordered    fluconazole (DIFLUCAN) 150 MG tablet     10/04/18 0348    metroNIDAZOLE (FLAGYL) 500 MG tablet  2 times daily     10/04/18 0348           Pollyann Roa A, PA-C 10/04/18 0357    Glynn Octave, MD 10/04/18 0530

## 2018-10-05 LAB — HIV ANTIBODY (ROUTINE TESTING W REFLEX): HIV Screen 4th Generation wRfx: NONREACTIVE

## 2018-10-05 LAB — URINE CULTURE: Culture: 10000 — AB

## 2018-10-06 ENCOUNTER — Telehealth: Payer: Self-pay | Admitting: Emergency Medicine

## 2018-10-06 LAB — GC/CHLAMYDIA PROBE AMP (~~LOC~~) NOT AT ARMC
Chlamydia: NEGATIVE
Neisseria Gonorrhea: NEGATIVE

## 2018-10-06 NOTE — Telephone Encounter (Signed)
Post ED Visit - Positive Culture Follow-up  Culture report reviewed by antimicrobial stewardship pharmacist: [place] Team []  Elenor Quinones, Pharm.D. []  Heide Guile, Pharm.D., BCPS AQ-ID []  Parks Neptune, Pharm.D., BCPS []  Alycia Rossetti, Pharm.D., BCPS []  [place], [place].D., BCPS, AAHIVP []  Legrand Como, Pharm.D., BCPS, AAHIVP []  Salome Arnt, PharmD, BCPS []  Johnnette Gourd, PharmD, BCPS []  Hughes Better, PharmD, BCPS []  Leeroy Cha, PharmD []  Laqueta Linden, PharmD, BCPS []  Albertina Parr, PharmD Agnes Lawrence PharmD  [place] Team []  Leodis Sias, PharmD []  Lindell Spar, PharmD []  Royetta Asal, PharmD []  Graylin Shiver, Rph []  Rema Fendt) Glennon Mac, PharmD []  Arlyn Dunning, PharmD []  Netta Cedars, PharmD []  Dia Sitter, PharmD []  Leone Haven, PharmD []  Gretta Arab, PharmD []  Theodis Shove, PharmD []  Peggyann Juba, PharmD []  Reuel Boom, PharmD   Positive urine culture Treated with fluconazole and metronidazole, organism sensitive to the same and no further patient follow-up is required at this time.  Hazle Nordmann 10/06/2018, 1:21 PM

## 2018-12-24 ENCOUNTER — Emergency Department (HOSPITAL_COMMUNITY)
Admission: EM | Admit: 2018-12-24 | Discharge: 2018-12-24 | Disposition: A | Discharge status: Home / Self Care | Payer: Medicaid - Out of State | Attending: Emergency Medicine | Admitting: Emergency Medicine

## 2018-12-24 ENCOUNTER — Other Ambulatory Visit: Payer: Self-pay

## 2018-12-24 ENCOUNTER — Encounter (HOSPITAL_COMMUNITY): Payer: Self-pay | Admitting: Emergency Medicine

## 2018-12-24 DIAGNOSIS — J45909 Unspecified asthma, uncomplicated: Secondary | ICD-10-CM | POA: Diagnosis not present

## 2018-12-24 DIAGNOSIS — R109 Unspecified abdominal pain: Secondary | ICD-10-CM | POA: Insufficient documentation

## 2018-12-24 DIAGNOSIS — K5900 Constipation, unspecified: Secondary | ICD-10-CM | POA: Insufficient documentation

## 2018-12-24 DIAGNOSIS — F1210 Cannabis abuse, uncomplicated: Secondary | ICD-10-CM | POA: Insufficient documentation

## 2018-12-24 LAB — PREGNANCY, URINE: Preg Test, Ur: NEGATIVE

## 2018-12-24 LAB — URINALYSIS, ROUTINE W REFLEX MICROSCOPIC
Bilirubin Urine: NEGATIVE
Glucose, UA: NEGATIVE mg/dL
Ketones, ur: NEGATIVE mg/dL
Leukocytes,Ua: NEGATIVE
Nitrite: NEGATIVE
Protein, ur: NEGATIVE mg/dL
Specific Gravity, Urine: 1.002 — ABNORMAL LOW (ref 1.005–1.030)
pH: 6 (ref 5.0–8.0)

## 2018-12-24 MED ORDER — MAGNESIUM CITRATE PO SOLN: 1.0000 | Freq: Once | ORAL | 0 refills | Status: AC

## 2018-12-24 MED ORDER — POLYETHYLENE GLYCOL 3350 17 G PO PACK: 17.0000 g | PACK | Freq: Every day | ORAL | 3 refills | Status: DC

## 2018-12-24 NOTE — ED Triage Notes (Signed)
Pt arrives to ED with c/c of constipation for 5 days with LLQ pain. States she has tried otc stool softer with no relief.

## 2018-12-24 NOTE — Discharge Instructions (Addendum)
Return if any problems.

## 2018-12-24 NOTE — ED Provider Notes (Signed)
[facility] EMERGENCY DEPARTMENT Provider Note   CSN: [id number] Arrival date & time: 12/24/18  1455     History   Chief Complaint Chief Complaint  Patient presents with   Constipation    HPI Michelle [name] is a 28 y.o. female.     The history is provided by the patient. No language interpreter was used.  Constipation Severity:  Severe Time since last bowel movement:  5 days Timing:  Constant Progression:  Worsening Chronicity:  New Context: not dietary changes   Stool description:  None produced Unusual stool frequency:  Daily Relieved by:  Nothing Worsened by:  Nothing Ineffective treatments:  None tried Associated symptoms: abdominal pain    Pt reports she took a stool softner without relief  Past Medical History:  Diagnosis Date   Asthma     There are no active problems to display for this patient.   Past Surgical History:  Procedure Laterality Date   FRACTURE SURGERY       OB History   No obstetric history on file.      Home Medications    Prior to Admission medications   Not on File    Family History No family history on file.  Social History Social History   Tobacco Use   Smoking status: Never Smoker   Smokeless tobacco: Never Used  Substance Use Topics   Alcohol use: Yes   Drug use: Yes    Types: Marijuana     Allergies   Other   Review of Systems Review of Systems  Gastrointestinal: Positive for abdominal pain and constipation.  All other systems reviewed and are negative.    Physical Exam Updated Vital Signs BP 108/72 (BP Location: Left Arm)    Pulse 75    Temp 98.5 F (36.9 C) (Oral)    Resp 17    SpO2 100%   Physical Exam Vitals signs and nursing note reviewed.  Constitutional:      Appearance: She is well-developed.  HENT:     Head: Normocephalic.     Nose: Nose normal.  Eyes:     Pupils: Pupils are equal, round, and reactive to light.  Neck:     Musculoskeletal: Normal range  of motion.  Cardiovascular:     Rate and Rhythm: Normal rate.  Pulmonary:     Effort: Pulmonary effort is normal.  Abdominal:     General: There is no distension.  Musculoskeletal: Normal range of motion.  Skin:    General: Skin is warm.  Neurological:     General: No focal deficit present.     Mental Status: She is alert and oriented to person, place, and time.  Psychiatric:        Mood and Affect: Mood normal.      ED Treatments / Results  Labs (all labs ordered are listed, but only abnormal results are displayed) Labs Reviewed  URINALYSIS, ROUTINE W REFLEX MICROSCOPIC - Abnormal; Notable for the following components:      Result Value   Color, Urine COLORLESS (*)    Specific Gravity, Urine 1.002 (*)    Hgb urine dipstick SMALL (*)    Bacteria, UA RARE (*)    All other components within normal limits  PREGNANCY, URINE    EKG None  Radiology No results found.  Procedures Procedures (including critical care time)  Medications Ordered in ED Medications - No data to display   Initial Impression / Assessment and Plan / ED Course  I  have reviewed the triage vital signs and the nursing notes.  Pertinent labs & imaging results that were available during my care of the patient were reviewed by me and considered in my medical decision making (see chart for details).       MDM: ua and upt negative.  No evidence of acute abdomen.  Pt advised to follow p with her MD for recheck.  Mag citrate x 1 bottle, miralax daily   Final Clinical Impressions(s) / ED Diagnoses   Final diagnoses:  Constipation, unspecified constipation type    ED Discharge Orders         Ordered    magnesium citrate SOLN   Once     12/24/18 1742    polyethylene glycol (MIRALAX) 17 g packet  Daily     12/24/18 1742        An After Visit Summary was printed and given to the patient.    Elson Areas, [place] 12/24/18 1742    Gwyneth Sprout, MD 12/24/18 [phone]

## 2019-08-09 ENCOUNTER — Ambulatory Visit (HOSPITAL_COMMUNITY)
Admission: EM | Admit: 2019-08-09 | Discharge: 2019-08-09 | Disposition: A | Discharge status: Home / Self Care | Payer: Medicaid - Out of State | Attending: Emergency Medicine | Admitting: Emergency Medicine

## 2019-08-09 ENCOUNTER — Other Ambulatory Visit: Payer: Self-pay

## 2019-08-09 ENCOUNTER — Encounter (HOSPITAL_COMMUNITY): Payer: Self-pay

## 2019-08-09 DIAGNOSIS — H9209 Otalgia, unspecified ear: Secondary | ICD-10-CM | POA: Insufficient documentation

## 2019-08-09 DIAGNOSIS — Z20822 Contact with and (suspected) exposure to covid-19: Secondary | ICD-10-CM | POA: Insufficient documentation

## 2019-08-09 DIAGNOSIS — R05 Cough: Secondary | ICD-10-CM | POA: Diagnosis not present

## 2019-08-09 DIAGNOSIS — Z3202 Encounter for pregnancy test, result negative: Secondary | ICD-10-CM | POA: Diagnosis not present

## 2019-08-09 DIAGNOSIS — J029 Acute pharyngitis, unspecified: Secondary | ICD-10-CM | POA: Diagnosis not present

## 2019-08-09 DIAGNOSIS — J069 Acute upper respiratory infection, unspecified: Secondary | ICD-10-CM | POA: Diagnosis not present

## 2019-08-09 DIAGNOSIS — N898 Other specified noninflammatory disorders of vagina: Secondary | ICD-10-CM | POA: Diagnosis not present

## 2019-08-09 DIAGNOSIS — J45909 Unspecified asthma, uncomplicated: Secondary | ICD-10-CM | POA: Insufficient documentation

## 2019-08-09 LAB — SARS CORONAVIRUS 2 (TAT 6-24 HRS): SARS Coronavirus 2: NEGATIVE

## 2019-08-09 LAB — POCT URINALYSIS DIP (DEVICE)
Bilirubin Urine: NEGATIVE
Glucose, UA: NEGATIVE mg/dL
Ketones, ur: NEGATIVE mg/dL
Leukocytes,Ua: NEGATIVE
Nitrite: NEGATIVE
Protein, ur: NEGATIVE mg/dL
Specific Gravity, Urine: 1.025 (ref 1.005–1.030)
Urobilinogen, UA: 0.2 mg/dL (ref 0.0–1.0)
pH: 7 (ref 5.0–8.0)

## 2019-08-09 LAB — POC URINE PREG, ED: Preg Test, Ur: NEGATIVE

## 2019-08-09 MED ORDER — AMOXICILLIN-POT CLAVULANATE 875-125 MG PO TABS: 1.0000 | ORAL_TABLET | Freq: Two times a day (BID) | ORAL | 0 refills | Status: AC

## 2019-08-09 MED ORDER — METRONIDAZOLE 500 MG PO TABS: 500.0000 mg | ORAL_TABLET | Freq: Two times a day (BID) | ORAL | 0 refills | Status: AC

## 2019-08-09 MED ORDER — IBUPROFEN 800 MG PO TABS: 800.0000 mg | ORAL_TABLET | Freq: Three times a day (TID) | ORAL | 0 refills | Status: DC

## 2019-08-09 MED ORDER — CETIRIZINE HCL 10 MG PO CAPS: 10.0000 mg | ORAL_CAPSULE | Freq: Every day | ORAL | 0 refills | Status: DC

## 2019-08-09 NOTE — Discharge Instructions (Addendum)
Begin Augmentin twice daily for the next week to cover for sinus infection, this will also cover most throat infections Tylenol and ibuprofen for pain Begin daily cetirizine to help with postnasal drainage and mucus in throat-take consistently  Vaginal swab pending to screen for causes of discharge.  Urine did not show signs of UTI. Begin metronidazole twice daily over the next week to treat for bacterial vaginosis.  Please follow-up if any symptoms not improving or worsening

## 2019-08-09 NOTE — ED Triage Notes (Signed)
Pt c/o productive cough with yellow sputum, sore throat, right ear pain congestion for approx 1 week.   Also c/o vag discharge with yellow color and foul odor for approx 1 week. Also reports urinary frequency and urgency.  Denies abdominal pain, n/v/d, fever, chills.

## 2019-08-09 NOTE — ED Provider Notes (Signed)
[facility]    CSN: [id number] Arrival date & time: 08/09/19  1048      History   Chief Complaint Chief Complaint  Patient presents with   Cough   Vaginal Discharge    HPI Michelle [name] is a 29 y.o. female history of asthma presenting today for evaluation of sore throat and vaginal discharge.  Patient reports for the past 1.5 weeks she has had sore throat with associated postnasal drainage, mucus production and slight cough.  Has felt swollen lymph nodes in her neck.  Denies fevers.  Denies rhinorrhea.  Has had some ear pain.  Denies any close sick contacts.  Also concerned about vaginal discharge which she has had for the past 1 to 2 weeks.  Reports that is yellow in color with associated odor.  She has had associated urinary frequency urgency and incomplete voiding.  Denies dysuria.  Denies itching or irritation.  Last menstrual cycle was 07/02.  Is not on any form of birth control.  Is sexually active with females.  HPI  Past Medical History:  Diagnosis Date   Asthma     There are no problems to display for this patient.   Past Surgical History:  Procedure Laterality Date   FRACTURE SURGERY      OB History   No obstetric history on file.      Home Medications    Prior to Admission medications   Medication Sig Start Date End Date Taking? Authorizing Provider  amoxicillin-clavulanate (AUGMENTIN) 875-125 MG tablet Take 1 tablet by mouth every 12 (twelve) hours for 7 days. With food 08/09/19 08/16/19  Yenni Carra C, PA-C  Cetirizine HCl 10 MG CAPS Take 1 capsule (10 mg total) by mouth daily for 10 days. 08/09/19 08/19/19  Carolos Fecher C, PA-C  ibuprofen (ADVIL) 800 MG tablet Take 1 tablet (800 mg total) by mouth 3 (three) times daily. 08/09/19   Rj Pedrosa C, PA-C  metroNIDAZOLE (FLAGYL) 500 MG tablet Take 1 tablet (500 mg total) by mouth 2 (two) times daily for 7 days. 08/09/19 08/16/19  Lumina Gitto, Junius Creamer, PA-C    Family History Family  History  Problem Relation Age of Onset   Healthy Mother    Healthy Father     Social History Social History   Tobacco Use   Smoking status: Never Smoker   Smokeless tobacco: Never Used  Building services engineer Use: Never used  Substance Use Topics   Alcohol use: Yes   Drug use: Yes    Types: Marijuana     Allergies   Other   Review of Systems Review of Systems  Constitutional: Negative for activity change, appetite change, chills, fatigue and fever.  HENT: Positive for congestion and sore throat. Negative for ear pain, rhinorrhea, sinus pressure and trouble swallowing.   Eyes: Negative for discharge and redness.  Respiratory: Positive for cough. Negative for chest tightness and shortness of breath.   Cardiovascular: Negative for chest pain.  Gastrointestinal: Negative for abdominal pain, diarrhea, nausea and vomiting.  Genitourinary: Positive for frequency, urgency and vaginal discharge. Negative for dysuria, flank pain, genital sores, hematuria, menstrual problem, vaginal bleeding and vaginal pain.  Musculoskeletal: Negative for back pain and myalgias.  Skin: Negative for rash.  Neurological: Negative for dizziness, light-headedness and headaches.     Physical Exam Triage Vital Signs ED Triage Vitals  Enc Vitals Group     BP 08/09/19 1205 99/61     Pulse Rate 08/09/19 1205 62  Resp 08/09/19 1205 18     Temp 08/09/19 1205 98.4 F (36.9 C)     Temp Source 08/09/19 1205 Oral     SpO2 08/09/19 1205 99 %     Weight --      Height --      Head Circumference --      Peak Flow --      Pain Score 08/09/19 1203 6     Pain Loc --      Pain Edu? --      Excl. in GC? --    No data found.  Updated Vital Signs BP 99/61 (BP Location: Left Arm)    Pulse 62    Temp 98.4 F (36.9 C) (Oral)    Resp 18    LMP 07/29/2019    SpO2 99%   Visual Acuity Right Eye Distance:   Left Eye Distance:   Bilateral Distance:    Right Eye Near:   Left Eye Near:    Bilateral  Near:     Physical Exam Vitals and nursing note reviewed.  Constitutional:      Appearance: She is well-developed.     Comments: No acute distress  HENT:     Head: Normocephalic and atraumatic.     Ears:     Comments: Bilateral ears without tenderness to palpation of external auricle, tragus and mastoid, EAC's without erythema or swelling, TM's with good bony landmarks and cone of light. Non erythematous.     Nose: Nose normal.     Mouth/Throat:     Comments: Oral mucosa appears erythematous and slightly irritated to soft palate and uvula, posterior pharynx erythematous, no ulcers or lesions noted, no tonsillar enlargement or exudate Eyes:     Conjunctiva/sclera: Conjunctivae normal.  Neck:     Comments: Left tonsillar lymphadenopathy to anterior cervical chain Cardiovascular:     Rate and Rhythm: Normal rate.  Pulmonary:     Effort: Pulmonary effort is normal. No respiratory distress.     Comments: Breathing comfortably at rest, CTABL, no wheezing, rales or other adventitious sounds auscultated Abdominal:     General: There is no distension.  Musculoskeletal:        General: Normal range of motion.     Cervical back: Neck supple.  Skin:    General: Skin is warm and dry.  Neurological:     Mental Status: She is alert and oriented to person, place, and time.      UC Treatments / Results  Labs (all labs ordered are listed, but only abnormal results are displayed) Labs Reviewed  POCT URINALYSIS DIP (DEVICE) - Abnormal; Notable for the following components:      Result Value   Hgb urine dipstick SMALL (*)    All other components within normal limits  SARS CORONAVIRUS 2 (TAT 6-24 HRS)  POC URINE PREG, ED  CERVICOVAGINAL ANCILLARY ONLY    EKG   Radiology No results found.  Procedures Procedures (including critical care time)  Medications Ordered in UC Medications - No data to display  Initial Impression / Assessment and Plan / UC Course  I have reviewed the  triage vital signs and the nursing notes.  Pertinent labs & imaging results that were available during my care of the patient were reviewed by me and considered in my medical decision making (see chart for details).     1.  URI symptoms/sore throat-symptoms x1.5 weeks, will go ahead and cover with antibiotics to treat sinusitis, continue symptomatic and supportive  care as well.  Covid PCR pending.  2.  Vaginal discharge-high suspicion of bacterial vaginosis, but vaginal swab pending to screen for STDs.  Treating with metronidazole twice daily x1 week.  Will call with results and alter treatment as needed.  Discussed strict return precautions. Patient verbalized understanding and is agreeable with plan.  Final Clinical Impressions(s) / UC Diagnoses   Final diagnoses:  Sore throat  Vaginal discharge  Upper respiratory tract infection, unspecified type     Discharge Instructions     Begin Augmentin twice daily for the next week to cover for sinus infection, this will also cover most throat infections Tylenol and ibuprofen for pain Begin daily cetirizine to help with postnasal drainage and mucus in throat-take consistently  Vaginal swab pending to screen for causes of discharge.  Urine did not show signs of UTI. Begin metronidazole twice daily over the next week to treat for bacterial vaginosis.  Please follow-up if any symptoms not improving or worsening    ED Prescriptions    Medication Sig Dispense Auth. Provider   metroNIDAZOLE (FLAGYL) 500 MG tablet Take 1 tablet (500 mg total) by mouth 2 (two) times daily for 7 days. 14 tablet Chicquita Mendel C, PA-C   amoxicillin-clavulanate (AUGMENTIN) 875-125 MG tablet Take 1 tablet by mouth every 12 (twelve) hours for 7 days. With food 14 tablet Mija Effertz C, PA-C   ibuprofen (ADVIL) 800 MG tablet Take 1 tablet (800 mg total) by mouth 3 (three) times daily. 21 tablet Tashia Leiterman C, PA-C   Cetirizine HCl 10 MG CAPS Take 1 capsule  (10 mg total) by mouth daily for 10 days. 10 capsule Davon Abdelaziz, [place] C, PA-C     PDMP not reviewed this encounter.   Lew Dawes, [place] 08/09/19 1257

## 2019-08-10 LAB — CERVICOVAGINAL ANCILLARY ONLY
Bacterial Vaginitis (gardnerella): POSITIVE — AB
Candida Glabrata: NEGATIVE
Candida Vaginitis: NEGATIVE
Chlamydia: NEGATIVE
Comment: NEGATIVE
Comment: NEGATIVE
Comment: NEGATIVE
Comment: NEGATIVE
Comment: NEGATIVE
Comment: NORMAL
Neisseria Gonorrhea: NEGATIVE
Trichomonas: NEGATIVE

## 2019-11-16 ENCOUNTER — Ambulatory Visit (HOSPITAL_COMMUNITY)
Admission: EM | Admit: 2019-11-16 | Discharge: 2019-11-16 | Discharge status: Against Medical Advice | Payer: Medicaid - Out of State | Attending: Family Medicine | Admitting: Family Medicine

## 2019-11-16 ENCOUNTER — Other Ambulatory Visit: Payer: Self-pay

## 2019-11-16 NOTE — ED Notes (Signed)
Patient was called x 1 for triage.

## 2019-12-11 ENCOUNTER — Encounter (HOSPITAL_COMMUNITY): Payer: Self-pay | Admitting: Emergency Medicine

## 2019-12-11 ENCOUNTER — Other Ambulatory Visit: Payer: Self-pay

## 2019-12-11 ENCOUNTER — Ambulatory Visit (HOSPITAL_COMMUNITY)
Admission: EM | Admit: 2019-12-11 | Discharge: 2019-12-11 | Disposition: A | Discharge status: Home / Self Care | Payer: Medicaid - Out of State | Attending: Emergency Medicine | Admitting: Emergency Medicine

## 2019-12-11 DIAGNOSIS — Z113 Encounter for screening for infections with a predominantly sexual mode of transmission: Secondary | ICD-10-CM | POA: Insufficient documentation

## 2019-12-11 DIAGNOSIS — J4521 Mild intermittent asthma with (acute) exacerbation: Secondary | ICD-10-CM | POA: Insufficient documentation

## 2019-12-11 MED ORDER — ALBUTEROL SULFATE HFA 108 (90 BASE) MCG/ACT IN AERS
2.0000 | INHALATION_SPRAY | Freq: Once | RESPIRATORY_TRACT | Status: AC
  Administered 2019-12-11: 2 via RESPIRATORY_TRACT

## 2019-12-11 MED ORDER — METHYLPREDNISOLONE SODIUM SUCC 125 MG IJ SOLR
125.0000 mg | Freq: Once | INTRAMUSCULAR | Status: AC
  Administered 2019-12-11: 125 mg via INTRAMUSCULAR

## 2019-12-11 MED ORDER — PREDNISONE 20 MG PO TABS: 40.0000 mg | ORAL_TABLET | Freq: Every day | ORAL | 0 refills | Status: AC

## 2019-12-11 MED ORDER — ALBUTEROL SULFATE HFA 108 (90 BASE) MCG/ACT IN AERS: 1.0000 | INHALATION_SPRAY | Freq: Four times a day (QID) | RESPIRATORY_TRACT | 0 refills | Status: DC | PRN

## 2019-12-11 MED ORDER — CETIRIZINE HCL 10 MG PO CAPS: 10.0000 mg | ORAL_CAPSULE | Freq: Every day | ORAL | 0 refills | Status: DC

## 2019-12-11 MED ORDER — METHYLPREDNISOLONE SODIUM SUCC 125 MG IJ SOLR
INTRAMUSCULAR | Status: AC
  Filled 2019-12-11: qty 2

## 2019-12-11 MED ORDER — ALBUTEROL SULFATE HFA 108 (90 BASE) MCG/ACT IN AERS
INHALATION_SPRAY | RESPIRATORY_TRACT | Status: AC
  Filled 2019-12-11: qty 6.7

## 2019-12-11 MED ORDER — CETIRIZINE HCL 10 MG PO TABS: 10.0000 mg | ORAL_TABLET | Freq: Every day | ORAL | 0 refills | Status: DC

## 2019-12-11 NOTE — Discharge Instructions (Signed)
Take zyrtec daily.  Use of inhaler as needed for wheezing or shortness of breath.   5 days of prednisone.  We will notify of you any positive findings from your std screen, or if any changes to treatment are needed. If normal or otherwise without concern to your results, we will not call you. Please log on to your MyChart to review your results if interested in so.   Please return for any worsening of symptoms.  Please establish with a primary care provider for long term management of your asthma.

## 2019-12-11 NOTE — ED Notes (Signed)
Instructed patient to finish getting registered, deed[place], patient access notified of this as well.

## 2019-12-11 NOTE — ED Provider Notes (Signed)
[facility]    CSN: [id number] Arrival date & time: 12/11/19  1436      History   Chief Complaint Chief Complaint  Patient presents with  . Asthma  . STD Check    HPI Michelle [name] is a 29 y.o. female.   Michelle [name] presents with complaints of asthma exacerbation, wheezing, which started yesterday. She is out of her inhaler. Doesn't take regular allergy medication. No fevers. Shortness of breath. Also requesting std screen as she states that her girlfriend tested positive for chlamydia she was told. Some vaginal discharge. No vaginal bleeding. No pelvic pain.    ROS per HPI, negative if not otherwise mentioned.      Past Medical History:  Diagnosis Date  . Asthma     There are no problems to display for this patient.   Past Surgical History:  Procedure Laterality Date  . FRACTURE SURGERY      OB History   No obstetric history on file.      Home Medications    Prior to Admission medications   Medication Sig Start Date End Date Taking? Authorizing Provider  albuterol (PROAIR HFA) 108 (90 Base) MCG/ACT inhaler Inhale 1-2 puffs into the lungs every 6 (six) hours as needed for wheezing or shortness of breath. 12/11/19   Georgetta Haber, NP  cetirizine (ZYRTEC) 10 MG tablet Take 1 tablet (10 mg total) by mouth daily. 12/11/19   Georgetta Haber, NP  Cetirizine HCl 10 MG CAPS Take 1 capsule (10 mg total) by mouth daily. 12/11/19 01/10/20  Georgetta Haber, NP  ibuprofen (ADVIL) 800 MG tablet Take 1 tablet (800 mg total) by mouth 3 (three) times daily. 08/09/19   Wieters, Hallie C, PA-C  predniSONE (DELTASONE) 20 MG tablet Take 2 tablets (40 mg total) by mouth daily with breakfast for 5 days. 12/11/19 12/16/19  Georgetta Haber, NP    Family History Family History  Problem Relation Age of Onset  . Healthy Mother   . Healthy Father     Social History Social History   Tobacco Use  . Smoking status: Never Smoker  . Smokeless tobacco: Never  Used  Vaping Use  . Vaping Use: Never used  Substance Use Topics  . Alcohol use: Yes  . Drug use: Yes    Types: Marijuana     Allergies   Other   Review of Systems Review of Systems   Physical Exam Triage Vital Signs ED Triage Vitals  Enc Vitals Group     BP 12/11/19 1440 121/89     Pulse Rate 12/11/19 1440 97     Resp 12/11/19 1440 (!) 24     Temp 12/11/19 1440 98.1 F (36.7 C)     Temp Source 12/11/19 1440 Oral     SpO2 12/11/19 1440 100 %     Weight --      Height --      Head Circumference --      Peak Flow --      Pain Score 12/11/19 1444 7     Pain Loc --      Pain Edu? --      Excl. in GC? --    No data found.  Updated Vital Signs BP 121/89 (BP Location: Right Arm)   Pulse 97   Temp 98.1 F (36.7 C) (Oral)   Resp (!) 24   LMP 11/21/2019   SpO2 100%   Visual Acuity Right Eye Distance:   Left Eye  Distance:   Bilateral Distance:    Right Eye Near:   Left Eye Near:    Bilateral Near:     Physical Exam Constitutional:      General: She is not in acute distress.    Appearance: She is well-developed.  Cardiovascular:     Rate and Rhythm: Normal rate.  Pulmonary:     Effort: Pulmonary effort is normal. Tachypnea present.     Breath sounds: Wheezing present.  Abdominal:     Palpations: Abdomen is not rigid.     Tenderness: There is no abdominal tenderness. There is no guarding or rebound.  Genitourinary:    Comments: Denies sores, lesions, vaginal bleeding; no pelvic pain; gu exam deferred at this time, vaginal self swab collected.   Skin:    General: Skin is warm and dry.  Neurological:     Mental Status: She is alert and oriented to person, place, and time.      UC Treatments / Results  Labs (all labs ordered are listed, but only abnormal results are displayed) Labs Reviewed  RPR  HIV ANTIBODY (ROUTINE TESTING W REFLEX)  CERVICOVAGINAL ANCILLARY ONLY    EKG   Radiology No results found.  Procedures Procedures (including  critical care time)  Medications Ordered in UC Medications  albuterol (VENTOLIN HFA) 108 (90 Base) MCG/ACT inhaler 2 puff (has no administration in time range)  methylPREDNISolone sodium succinate (SOLU-MEDROL) 125 mg/2 mL injection 125 mg (has no administration in time range)    Initial Impression / Assessment and Plan / UC Course  I have reviewed the triage vital signs and the nursing notes.  Pertinent labs & imaging results that were available during my care of the patient were reviewed by me and considered in my medical decision making (see chart for details).     Solumedrol and inhaler provided in clinic today for asthma flair. Std screen collected and pending. Safe sex encouraged. Return precautions provided. Patient verbalized understanding and agreeable to plan.   Final Clinical Impressions(s) / UC Diagnoses   Final diagnoses:  Mild intermittent asthma with exacerbation  Screen for STD (sexually transmitted disease)     Discharge Instructions     Take zyrtec daily.  Use of inhaler as needed for wheezing or shortness of breath.   5 days of prednisone.  We will notify of you any positive findings from your std screen, or if any changes to treatment are needed. If normal or otherwise without concern to your results, we will not call you. Please log on to your MyChart to review your results if interested in so.   Please return for any worsening of symptoms.  Please establish with a primary care provider for long term management of your asthma.    ED Prescriptions    Medication Sig Dispense Auth. Provider   albuterol (PROAIR HFA) 108 (90 Base) MCG/ACT inhaler Inhale 1-2 puffs into the lungs every 6 (six) hours as needed for wheezing or shortness of breath. 1 each Georgetta Haber, NP   predniSONE (DELTASONE) 20 MG tablet Take 2 tablets (40 mg total) by mouth daily with breakfast for 5 days. 10 tablet Linus Mako B, NP   cetirizine (ZYRTEC) 10 MG tablet Take 1 tablet (10  mg total) by mouth daily. 30 tablet Linus Mako B, NP   Cetirizine HCl 10 MG CAPS Take 1 capsule (10 mg total) by mouth daily. 30 capsule Georgetta Haber, NP     PDMP not reviewed this encounter.  Georgetta Haber, NP 12/11/19 1541

## 2019-12-11 NOTE — ED Triage Notes (Signed)
patient has been having issues with asthma for one day ,  Does not have any inhaler, ran out.  Denies runny nose.  Does report a cough  Patient's first question for this nurse was can she get tested for everything, clarification meant STD

## 2019-12-12 LAB — CERVICOVAGINAL ANCILLARY ONLY
Bacterial Vaginitis (gardnerella): POSITIVE — AB
Candida Glabrata: NEGATIVE
Candida Vaginitis: POSITIVE — AB
Chlamydia: NEGATIVE
Comment: NEGATIVE
Comment: NEGATIVE
Comment: NEGATIVE
Comment: NEGATIVE
Comment: NEGATIVE
Comment: NORMAL
Neisseria Gonorrhea: NEGATIVE
Trichomonas: POSITIVE — AB

## 2019-12-12 LAB — RPR: RPR Ser Ql: NONREACTIVE

## 2019-12-12 LAB — HIV ANTIBODY (ROUTINE TESTING W REFLEX): HIV Screen 4th Generation wRfx: NONREACTIVE

## 2019-12-13 ENCOUNTER — Telehealth (HOSPITAL_COMMUNITY): Payer: Self-pay | Admitting: Emergency Medicine

## 2019-12-13 MED ORDER — FLUCONAZOLE 150 MG PO TABS: 150.0000 mg | ORAL_TABLET | Freq: Once | ORAL | 0 refills | Status: AC

## 2019-12-13 MED ORDER — METRONIDAZOLE 500 MG PO TABS: 500.0000 mg | ORAL_TABLET | Freq: Two times a day (BID) | ORAL | 0 refills | Status: DC

## 2020-05-03 ENCOUNTER — Ambulatory Visit (HOSPITAL_COMMUNITY): Payer: Self-pay

## 2020-05-14 ENCOUNTER — Ambulatory Visit (HOSPITAL_COMMUNITY)
Admission: EM | Admit: 2020-05-14 | Discharge: 2020-05-14 | Disposition: A | Discharge status: Home / Self Care | Payer: Medicaid - Out of State | Attending: Emergency Medicine | Admitting: Emergency Medicine

## 2020-05-14 ENCOUNTER — Other Ambulatory Visit: Payer: Self-pay

## 2020-05-14 ENCOUNTER — Ambulatory Visit: Payer: Medicaid - Out of State | Admitting: Internal Medicine

## 2020-05-14 ENCOUNTER — Encounter (HOSPITAL_COMMUNITY): Payer: Self-pay

## 2020-05-14 DIAGNOSIS — Z113 Encounter for screening for infections with a predominantly sexual mode of transmission: Secondary | ICD-10-CM | POA: Insufficient documentation

## 2020-05-14 DIAGNOSIS — N898 Other specified noninflammatory disorders of vagina: Secondary | ICD-10-CM

## 2020-05-14 LAB — POCT URINALYSIS DIPSTICK, ED / UC
Bilirubin Urine: NEGATIVE
Glucose, UA: NEGATIVE mg/dL
Ketones, ur: NEGATIVE mg/dL
Nitrite: NEGATIVE
Protein, ur: NEGATIVE mg/dL
Specific Gravity, Urine: 1.025 (ref 1.005–1.030)
Urobilinogen, UA: 0.2 mg/dL (ref 0.0–1.0)
pH: 5 (ref 5.0–8.0)

## 2020-05-14 NOTE — Discharge Instructions (Signed)
There is no obvious urinary tract infection to your urine so I have sent it to be cultured. We will notify of you any positive findings or if any changes to treatment are needed. If normal or otherwise without concern to your results, we will not call you. Please log on to your MyChart to review your results if interested in so.

## 2020-05-14 NOTE — ED Provider Notes (Signed)
[facility]    CSN: [id number] Arrival date & time: 05/14/20  1557      History   Chief Complaint Chief Complaint  Patient presents with  . Vaginal Discharge    HPI Aurelie Dicenzo is a 30 y.o. female.   [place] presents with complaints of vaginal discharge with odor which started around a week ago. Occasional pelvic cramping despite not being on her period. No fevers. Has had increased urination but no dysuria. Has had similar in the past related to UTI. She has one partner who has had other partners, therefore she has concern about std's and is interested in testing today. No back pain.     ROS per HPI, negative if not otherwise mentioned.      Past Medical History:  Diagnosis Date  . Asthma     There are no problems to display for this patient.   Past Surgical History:  Procedure Laterality Date  . FRACTURE SURGERY      OB History   No obstetric history on file.      Home Medications    Prior to Admission medications   Medication Sig Start Date End Date Taking? Authorizing Provider  albuterol (PROAIR HFA) 108 (90 Base) MCG/ACT inhaler Inhale 1-2 puffs into the lungs every 6 (six) hours as needed for wheezing or shortness of breath. 12/11/19   Georgetta Haber, NP  cetirizine (ZYRTEC) 10 MG tablet Take 1 tablet (10 mg total) by mouth daily. 12/11/19   Georgetta Haber, NP  Cetirizine HCl 10 MG CAPS Take 1 capsule (10 mg total) by mouth daily. 12/11/19 01/10/20  Georgetta Haber, NP  ibuprofen (ADVIL) 800 MG tablet Take 1 tablet (800 mg total) by mouth 3 (three) times daily. 08/09/19   Wieters, Hallie C, PA-C  metroNIDAZOLE (FLAGYL) 500 MG tablet Take 1 tablet (500 mg total) by mouth 2 (two) times daily. 12/13/19   LampteyBritta Mccreedy, MD    Family History Family History  Problem Relation Age of Onset  . Healthy Mother   . Healthy Father     Social History Social History   Tobacco Use  . Smoking status: Never Smoker  . Smokeless  tobacco: Never Used  Vaping Use  . Vaping Use: Never used  Substance Use Topics  . Alcohol use: Yes  . Drug use: Yes    Types: Marijuana     Allergies   Other   Review of Systems Review of Systems   Physical Exam Triage Vital Signs ED Triage Vitals  Enc Vitals Group     BP 05/14/20 1639 101/68     Pulse Rate 05/14/20 1639 60     Resp 05/14/20 1639 18     Temp 05/14/20 1639 98.9 F (37.2 C)     Temp src --      SpO2 05/14/20 1639 97 %     Weight --      Height --      Head Circumference --      Peak Flow --      Pain Score 05/14/20 1638 0     Pain Loc --      Pain Edu? --      Excl. in GC? --    No data found.  Updated Vital Signs BP 101/68   Pulse 60   Temp 98.9 F (37.2 C)   Resp 18   LMP 05/11/2020 (Exact Date)   SpO2 97%   Visual Acuity Right Eye Distance:  Left Eye Distance:   Bilateral Distance:    Right Eye Near:   Left Eye Near:    Bilateral Near:     Physical Exam Constitutional:      General: She is not in acute distress.    Appearance: She is well-developed.  Cardiovascular:     Rate and Rhythm: Normal rate.  Pulmonary:     Effort: Pulmonary effort is normal.  Abdominal:     Palpations: Abdomen is not rigid.     Tenderness: There is no abdominal tenderness. There is no guarding or rebound.  Genitourinary:    Comments: Denies sores, lesions, vaginal bleeding; no pelvic pain; gu exam deferred at this time, vaginal self swab collected.   Skin:    General: Skin is warm and dry.  Neurological:     Mental Status: She is alert and oriented to person, place, and time.      UC Treatments / Results  Labs (all labs ordered are listed, but only abnormal results are displayed) Labs Reviewed  POCT URINALYSIS DIPSTICK, ED / UC - Abnormal; Notable for the following components:      Result Value   Hgb urine dipstick SMALL (*)    Leukocytes,Ua TRACE (*)    All other components within normal limits  URINE CULTURE  CERVICOVAGINAL  ANCILLARY ONLY    EKG   Radiology No results found.  Procedures Procedures (including critical care time)  Medications Ordered in UC Medications - No data to display  Initial Impression / Assessment and Plan / UC Course  I have reviewed the triage vital signs and the nursing notes.  Pertinent labs & imaging results that were available during my care of the patient were reviewed by me and considered in my medical decision making (see chart for details).     Patient declines empiric std treatment today, will await final cytology results. Likely contaminated urine, culture pending to validate. Safe sex encouraged. Patient verbalized understanding and agreeable to plan.    Final Clinical Impressions(s) / UC Diagnoses   Final diagnoses:  Vaginal discharge  Screen for STD (sexually transmitted disease)     Discharge Instructions     There is no obvious urinary tract infection to your urine so I have sent it to be cultured. We will notify of you any positive findings or if any changes to treatment are needed. If normal or otherwise without concern to your results, we will not call you. Please log on to your MyChart to review your results if interested in so.      ED Prescriptions    None     PDMP not reviewed this encounter.   Georgetta Haber, NP 05/14/20 1734

## 2020-05-14 NOTE — ED Triage Notes (Addendum)
Pt in with c/o foul odor vaginal discharge x 1 week   Pt requesting refill on her inhaler

## 2020-05-15 LAB — CERVICOVAGINAL ANCILLARY ONLY
Bacterial Vaginitis (gardnerella): POSITIVE — AB
Candida Glabrata: NEGATIVE
Candida Vaginitis: NEGATIVE
Chlamydia: NEGATIVE
Comment: NEGATIVE
Comment: NEGATIVE
Comment: NEGATIVE
Comment: NEGATIVE
Comment: NEGATIVE
Comment: NORMAL
Neisseria Gonorrhea: NEGATIVE
Trichomonas: POSITIVE — AB

## 2020-05-16 ENCOUNTER — Telehealth (HOSPITAL_COMMUNITY): Payer: Self-pay | Admitting: Emergency Medicine

## 2020-05-16 LAB — URINE CULTURE: Culture: NO GROWTH

## 2020-05-16 MED ORDER — METRONIDAZOLE 500 MG PO TABS: 500.0000 mg | ORAL_TABLET | Freq: Two times a day (BID) | ORAL | 0 refills | Status: DC

## 2020-07-28 NOTE — Telephone Encounter (Signed)
Name of Caller: Julie Robles phone number: [phone]    Relationship to Patient: patient    Provider: Christophe Louis    Practice:  Urogynecology    Chief Complaint/Reason for Call: Pt sent form online to be seen for hyperandrogenic anovulation PCOS, kidney health, [facility] management. Please call to schedule.     Best time of day caller can be reached:        Patient advised that office/PCP has 24-48 business hours to return their call:

## 2020-07-31 NOTE — Telephone Encounter (Addendum)
Previous Urologist will send over Patient's pervious information. Will keep an eye out for records, 08/01/20, 08/02/20, 08/03/20, 08/04/20, 08/07/20, 08/08/20, 08/09/20, 08/10/20, 08/15/20, 08/16/20, 08/17/20, 08/20/20, 08/21/20, 08/22/20, 08/23/20, 08/24/20, 08/29/20

## 2020-08-01 NOTE — Telephone Encounter (Signed)
Name of Caller: Julie Robles    Relation to patient: patient    Contact Phone Number: [phone]    Appointment Scheduled with: Dr.Hom    Appointment Date & Time: 12/10/2020 at 8:20 AM    Reason for visit (are you having any symptoms) : wellness check and establish care    Transportation Issues/ concerns: n/a    Special Accommodations? ( wheel chair, etc) : n/a     Current medications: n/a    Any refills need?: n/a    Any chronic conditions you would like the physician to know of?: no

## 2020-08-30 NOTE — Telephone Encounter (Signed)
Called patient and lvm to call back regarding this referral.  Will also send letter in mail.

## 2020-09-03 NOTE — Telephone Encounter (Signed)
Patient was transferred to our office today after speaking with Urology. I advised the patient we do not treat for any of the issues she was having. Patient states she needs to have her annual examination as well and I gave her the number for [facility] office.

## 2020-10-29 ENCOUNTER — Encounter
Payer: PRIVATE HEALTH INSURANCE | Attending: Obstetrics | Primary: Student in an Organized Health Care Education/Training Program

## 2020-11-07 ENCOUNTER — Encounter
Payer: PRIVATE HEALTH INSURANCE | Attending: Surgery | Primary: Student in an Organized Health Care Education/Training Program

## 2020-11-14 ENCOUNTER — Encounter: Attending: Internal Medicine | Primary: Student in an Organized Health Care Education/Training Program

## 2020-11-22 ENCOUNTER — Ambulatory Visit
Admit: 2020-11-22 | Discharge: 2020-11-22 | Payer: PRIVATE HEALTH INSURANCE | Attending: Surgery | Primary: Student in an Organized Health Care Education/Training Program

## 2020-11-22 DIAGNOSIS — K219 Gastro-esophageal reflux disease without esophagitis: Secondary | ICD-10-CM

## 2020-11-22 NOTE — Progress Notes (Signed)
[place]  SURGICAL WEIGHT LOSS MANAGEMENT PROGRAM   PROGRESS NOTE INITIAL EVALUATION     Patient: Julie Robles    Date of Birth: [birth year]  Service Date: 11/22/20    Patient History:  The patient is a pleasant 30 y.o. year old female  with morbid obesity, who stands Height: 5' 2" (157.5 cm) (BCC HGT [place]) tall with a weight of Weight: 248 lb 9.6 oz (112.8 kg) , resulting in a BMI of Body mass index is 45.47 kg/m??.. She is interested in discussing weight loss surgery. The patient suffers from multiple co-morbidities as a result of morbid obesity, including:     PMHx:  Past Medical History:   Diagnosis Date    Anemia     Anesthesia complication     Anxiety     Arthritis     THUMBS    Daytime sleepiness     Depression     Fatigue     GERD (gastroesophageal reflux disease)     Heartburn     History of UTI     HTN (hypertension)     Hyperlipidemia     Hypothyroidism     Irregular menstrual cycle     Morbid obesity due to excess calories ([place]) 10/30/2020    Obesity     Pre-diabetes     Prediabetes     Snoring     SOBOE (shortness of breath on exertion)        Co-Morbidity Assessment  Co-Morbidity Initial Evaluation Today???s Visit   Type 2 DM Y    HTN Y    OSA N    GERD Y    Elevated CHO/Lipids Y    Depression Y      The patient denies  a history of myocardial infarction, deep vein thrombosis, pulmonary embolism, renal failure, hepatic failure, stroke, and seizure.    PSHx:  Past Surgical History:   Procedure Laterality Date    BONE GRAFT  2014    [facility]    FACIAL RECONSTRUCTION SURGERY  2005    DOG BITE- [facility]    WISDOM TOOTH EXTRACTION  2017       Social History:   This patient is alone for the evaluation today,  She does not smoke, and does not drink alcohol.    ROS:  General: negative for - chills, fatigue, fever or malaise  Ophthalmic: negative for - blurry vision, double vision or dry eyes  ENT: negative for - headaches, hearing change, nasal congestion, sinus pain or sore  throat  Endocrine: negative for - hot flashes, polydipsia/polyuria or skin changes  Respiratory: negative for - cough, shortness of breath or wheezing  Cardiovascular: negative for - chest pain, dyspnea on exertion, irregular heartbeat, murmur, rapid heart rate or shortness of breath  Gastrointestinal: negative for - change in bowel habits,  hemoptysis, hematemesis, hematochezia, dysphagia, nausea, vomiting, diarrhea, constipation, weight loss  Genito-Urinary: negative for - dysuria, hematuria, incontinence or nocturia  Musculoskeletal: negative for - joint pain, joint stiffness, joint swelling, muscle pain or muscular weakness  Neurological: negative for - confusion, dizziness, headaches or numbness/tingling    Physical Examination:   BP 124/88    Pulse 73    Temp 97.5 ??F (36.4 ??C)    Resp 16    Ht 5' 2" (1.575 m) Comment: BCC HGT [place]   Wt 248 lb 9.6 oz (112.8 kg)    BMI 45.47 kg/m??     General:  This patient is awake,  alert, and oriented, and is in no apparent distress.    Respiratory:  Non-labored breathing  Abdomen:  Obese, soft, non-tender, non-distended, without masses. No prior abdominal surgery and no scars are noted.   Head and Neck: Obese, normocephalic and atraumatic. Soft and supple.  Extremities:  No cyanosis, clubbing or edema/ No calf tenderness/No restrictions of movement, is ambulatory without assistance.  Neurological:  Intact x 4 extremities, no focal deficits notes.  Skin:   No rashes or lesions noted.  Rectal:   Not Done  Hernia:  no hernias found on exam      Assessment:      The patient has failed multiple attempts at non-surgical weight loss, and is now seeking surgical intervention to promote permanent and consistent weight loss. She  has chosen Laparoscopic Roux-en-Y Gastric Bypass.  She is well educated regarding it, as she has recently viewed our weight loss surgery informational seminar .     In anticipation of weight reductive surgery now or in the future, we spent a great deal of time  discussing the risks and benefits of, including but not limited to injury to intra-abdominal organs, breakdown of the gastric staple line, the need for re-operative therapy,  prolonged hospitalization,  mechanical ventilation,  and death. We discussed the possibility of bleeding, the need for blood transfusions, blood clots, hospital-acquired and intra-abdominal infection, anastomotic stricture, and worsening GERD.  And we discussed the need for post-operative visit compliance, behavior modifications and diet changes, protein and vitamin supplementation, as well as routine scheduled and dedicated exercise.  We discussed the potential weight loss benefit of approximately 60-70% of her excess body weight at 12-18 months post-op (LRYGB) and 50% of her excess body weight loss at 12-18 months after a LSG, as well as the possibility of insufficient weight loss or weight gain after 2 years post-operative time. Upon completion of all required pre-operative testing we will submit for insurance pre-authorization.     All female patients of childbearing age were advised to refrain from becoming pregnant for 12-18 months after weight loss surgery.  I advised them to discuss with their physician/gynecologist regarding contraception to prevent pregnancy during this period of time after surgery.  In addition, all patients were counseled on compliance with prescribed vitamin supplementation, office visit follow-up and compliance with program standards.    Plan:    Initial Pre-Operative Testing     Primary Procedure: Laparoscopic Roux-en-Y Gastric Bypass    Other Procedures: Laparoscopic liver biopsy    Initial Testing: Initial Pre-Surgical Lab Tests (CMP, TSH, Fasting Lipid Profile, Mg, Zinc, Vit B1 (whole blood), Vit B12, 25-OH Vit D, Fe, Ferritin, Folate), Ultrasound of the Abdomen, and Upper GI Endoscopy for GERD which has been untreated. Please also include HbA1c in the labs    Initial Consults: Psychological Evaluation and  Clearance, Bariatric Nutrition Assessment and Clearance, and Cardiology consult for clearance    Other Consultations: None    Pain Management Plan: None    Obtain prior medical records from:     Physician Supervised Diet and Exercise required by the patient???s insurance company: 3 months.      Pre-operative Optifast requirement: 2 weeks    Final Pre-Operative Testing    Screening CXR and EKG within 6 months of date of surgery including final Pre-Surgical Lab Tests  within 3 months of date of surgery (CBC, BMP)    Schedule final pre-operative office visit with surgeon, pre-operative education class, and pre-operative exercise class prior to date of surgery.  I met with her today to discuss risks and benefits of laparoscopic weight loss surgery, the risks and benefits are outlined as above and visual aids were used to describe the procedure.     I personally performed the evaluation and management of Blakley Michna in the development of a treatment plan for this patient. I personally interviewed the patient and performed an individual physical examination. In addition, I discussed the patient's condition and treatment options with them. I have also reviewed and agree with the past medical, family and social history unless otherwise noted. All of the patient's questions were answered.     I discussed/counseled the patient regarding the risks and benefits of surgery as well as the preoperative and postoperative care plan for this patient. The patient was seen and examined independently and relevant data reviewed by myself. A full chart review was performed. Due to the complexity of the patients condition as well as having at least one medical condition as listed above in medical history that is progressing despite medical management, this patient is considered high medical decision making and surgical intervention is necessary to help decrease the risk of continued chronic illness.     The patient was seen and  examined independently and relevant data reviewed by myself. A full chart review was performed.    Patient Care Team:  Consuello Bossier, MD as PCP - General (Family Medicine)  Michaell Cowing, MD as Consulting Physician (Internal Medicine)  Lindie Spruce, MD as Consulting Physician (General Surgery)  Consuello Bossier, MD (Family Medicine)  Velia Meyer, MD as Consulting Physician (General Surgery)    Electronically signed by Velia Meyer, MD on 11/22/2020 at 6:22 PM

## 2020-11-22 NOTE — Telephone Encounter (Signed)
Initial [facility] surgical patient Navigation & Financial Counseling Discussion     Patient Communication: In office     SURGEON:        []  JZ          []   AD     []   MP         []   TB          []   LM    PROCEDURE:   []  LRYGB  []   LSG   []  SADI-S   []  SADI                              []  UNDECIDED         []  REV:  SPECIFY:____________________    Confirmed pt wants to continue with surgical program/plan          []  YES   []  NO (complete program withdrawal note/process)     CO-MORBIDS:    []  NONE  []  DM  [] HTN  []  OSA  [] GERD  []  OTH:     PRIVATE PAY:    []  NO       [] YES     DATE OF INITIAL BENEFITS VERIFICATION:      TRANSFER FU:  []  YES   []  NO    PRIMARY INSURANCE: Payor: / Plan: [employer] PPO / Product Type: *No Product type* /   BENEFIT ON PLAN: []  NO  []  YES  BENEFIT MAX:  []  NO  []  YES  -- BENEFIT MAX: $  EMPLOYER:   DIET AND EXERCISE (DE) REQUIREMENT PRIMARY      []  NONE  [] 69M   []  85M  [] 366M    []  Medicare 4 Months  []  SPR (69M)    [] OTHER: _____    SECONDARY INSURANCE:   BENEFIT ON PLAN: []  NO  []  YES  BENEFIT MAX:         []  NO  []  YES  -- BENEFIT MAX: $_____  AUTH REQUIRED FROM SECONDARY    []   NO   []  YES  DIET AND EXERCISE REQUIREMENT SECONDARY      []  NONE     [] 69M  []  85M       []  Medicare 4 months   []  SPR (69M)    [] OTHER: ___    [x]  Discussed with patient:               Financial cost overview (document signed and pt given copy at new pt consult visit with surgeon),               Initial appointments: Bariatric Nutrition Assessment  (BNA)  & Diet and Exercise (DE)               Patient to look for yellow envelope in mail.  This yellow envelope will contain orders for labs, testing and required                clearances.                Pt encouraged to complete early in program to prevent delays.  Encourage blood work to be draw by 1st DE               appointment.   [x]   Reviewed OOP cost, including:   []  Optifast cost of approximately $130-140/week x weeks immediately prior to surgery - used to induce  rapid weight loss  which results in decrease in size of liver and therefore facilitates laparoscopically surgery approach.    [x] Overview of inpatient admission benefits - estimated inpatient co-pays, deductibles and/or co-insurance - Estimated OOP costs form reviewed with patient, and copy given to patient at new pt visit.   [x] Reviewed next steps with patient:     1) Scheduled at new pt surgeon visit:                  Registered Dietician (RD) for a Nutrition Assessment (BNA) and  Pre-operative Diet and Exercise (DE) appointment #1.                     [x] Patient reminded to arrive 15 minutes early for check in.  Late arrivals may need to be rescheduled.                2) Schedule:  Diet and Exercise Apt #2 only scheduled after initial BNA and DE completed,                3) Behavioral Health apt scheduled after DE started.   Reviewed rational and goal of Behavioral Health appointments.                4) [x] Reinforced need to cancel any WMI appointments 48 hours in advance.                      Cautioned NS/Same day cancellations may result in delay in program or program  completion hold.  Noted:  DE                     series needs to be a monthly series or insurance company may require repeat of the entire series.                 5)  [x] Smoker/tobacco products including vaping:  reviewed need for cessation before surgery clearance and life long                    abstinence after surgery for best outcomes.     Patient navigation to surgery:   [x]  Explained to patient that average time from initial consult to date of surgery can be 6-8 months.           - Process can take longer if there are cancelled appointments, delays in testing and/or additional clearances that needs to             be completed.            - Reviewed importance of patient active engagement in making and keeping appointments to keep the process moving.            - Reinforced need to cancel appointments at least 48 hours in advance.             **Reviewed that instances of No Shows and Same Day Appointment Cancellations may result in program/surgery delay               or hold.   [x]  Patient advised of importance of having voicemail and MyChart for office communications and lab/testing results before and after surgery.

## 2020-11-22 NOTE — Progress Notes (Signed)
[facility]  SURGICAL WEIGHT LOSS MANAGEMENT PROGRAM   PROGRESS NOTE - INITIAL CONSULTATION    Patient: Julie Robles   Date of Birth: [birth year]  Service Date: 11/22/20         Patient is here today to discuss the possibility of weight loss surgery. This patient is alone for the evaluation today    she is interested in discussing  WLS    Physician Supervised D/E: 3  Established Cardiologist: no  Established Pulmonary: no    Weight Metrics:  Date of Initial Consultation: Consult Date: 11/22/20    Initial Weight: Initial Weight: 248 lb 9.6 oz (112.8 kg)    Initial BMI: Initial BMI: 45.46    Ideal Body Weight: Ideal Body Weight: 125 lb (56.7 kg)    Excess Body Weight: 55 lbs    Waist Circumference: 15 in (add to Surgical Wt Management Flowsheet)  Neck Circumference: 15 in (add to Surgical Wt Management Flowsheet)    Falls Risk Assessment  Patient does take medications which affect BP or mental status   Patient does not have newly prescribed or changed dosage of medications within past 30 days which affect BP or mental status  Patient has not fallen in the past 2 months  Patient does not demonstrate unsteady gait  Patient uses the following ambulatory assistive devices: none  Patient states the presence of the following traits which increases risk of fall:    NA    none      Patient is low risk for falls. If high or moderate risk, patient instructed not to ambulate independently in the Center, and cord for call light placed within reach of patient.    Patient is not on home O2    Completed by:  Kerrin Champagne, LPN

## 2020-11-22 NOTE — Other (Unsigned)
Patient Acct Nbr: [id number]   Primary AUTH/CERT:   Primary Insurance Company Name: [employer]  Primary Insurance Plan name: Rosann Auerbach  Primary Insurance Group Number: [id number]  Primary Insurance Plan Type: Health  Primary Insurance Policy Number: [id number]

## 2020-11-28 ENCOUNTER — Other Ambulatory Visit: Payer: Self-pay

## 2020-11-28 ENCOUNTER — Encounter (HOSPITAL_COMMUNITY): Payer: Self-pay

## 2020-11-28 ENCOUNTER — Ambulatory Visit (HOSPITAL_COMMUNITY)
Admission: EM | Admit: 2020-11-28 | Discharge: 2020-11-28 | Disposition: A | Discharge status: Home / Self Care | Payer: BLUE CROSS/BLUE SHIELD | Attending: Emergency Medicine | Admitting: Emergency Medicine

## 2020-11-28 DIAGNOSIS — B009 Herpesviral infection, unspecified: Secondary | ICD-10-CM

## 2020-11-28 DIAGNOSIS — H5712 Ocular pain, left eye: Secondary | ICD-10-CM | POA: Diagnosis not present

## 2020-11-28 HISTORY — DX: Herpesviral conjunctivitis: B00.53

## 2020-11-28 MED ORDER — VALACYCLOVIR HCL 1 G PO TABS: 1000.0000 mg | ORAL_TABLET | Freq: Three times a day (TID) | ORAL | 0 refills | Status: AC

## 2020-11-28 NOTE — ED Provider Notes (Signed)
[facility]    CSN: [id number] Arrival date & time: 11/28/20  1325      History   Chief Complaint Chief Complaint  Patient presents with   Eye Problem    L    HPI Michelle [name] is a 30 y.o. female.   Patient here for evaluation of left eye redness, swelling, and pain that has been ongoing since yesterday.  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  Reports similar symptoms with herpes flares in the past.  Reports pain and irritation have gotten progressively worse since yesterday.  Reports watery discharge from left eye.  Denies any vision changes or photophobia.  Has not taken any OTC medications or treatments.  Denies any trauma, injury, or other precipitating event.  Denies any specific alleviating or aggravating factors.  Denies any fevers, chest pain, shortness of breath, N/V/D, numbness, tingling, weakness, abdominal pain, or headaches.    The history is provided by the patient.  Eye Problem Associated symptoms: discharge, itching and redness    Past Medical History:  Diagnosis Date   Asthma    Herpes simplex conjunctivitis of both eyes     There are no problems to display for this patient.   Past Surgical History:  Procedure Laterality Date   FRACTURE SURGERY      OB History   No obstetric history on file.      Home Medications    Prior to Admission medications   Medication Sig Start Date End Date Taking? Authorizing Provider  albuterol (PROAIR HFA) 108 (90 Base) MCG/ACT inhaler Inhale 1-2 puffs into the lungs every 6 (six) hours as needed for wheezing or shortness of breath. 12/11/19  Yes Burky, Dorene Grebe B, NP  valACYclovir (VALTREX) 1000 MG tablet Take 1 tablet (1,000 mg total) by mouth 3 (three) times daily for 7 days. 11/28/20 12/05/20 Yes Ivette Loyal, NP  cetirizine (ZYRTEC) 10 MG tablet Take 1 tablet (10 mg total) by mouth daily. 12/11/19   Georgetta Haber, NP  Cetirizine HCl 10 MG CAPS Take 1 capsule (10 mg total) by mouth  daily. 12/11/19 01/10/20  Georgetta Haber, NP  ibuprofen (ADVIL) 800 MG tablet Take 1 tablet (800 mg total) by mouth 3 (three) times daily. 08/09/19   Wieters, Hallie C, PA-C  metroNIDAZOLE (FLAGYL) 500 MG tablet Take 1 tablet (500 mg total) by mouth 2 (two) times daily. 05/16/20   Lamptey, Britta Mccreedy, MD    Family History Family History  Problem Relation Age of Onset   Healthy Mother    Healthy Father     Social History Social History   Tobacco Use   Smoking status: Never   Smokeless tobacco: Never  Vaping Use   Vaping Use: Never used  Substance Use Topics   Alcohol use: Yes   Drug use: Yes    Types: Marijuana     Allergies   Patient has no active allergies.   Review of Systems Review of Systems  Eyes:  Positive for discharge, redness and itching.  All other systems reviewed and are negative.   Physical Exam Triage Vital Signs ED Triage Vitals  Enc Vitals Group     BP 11/28/20 1556 (!) 137/54     Pulse Rate 11/28/20 1556 (!) 54     Resp 11/28/20 1556 18     Temp 11/28/20 1556 98.3 F (36.8 C)     Temp Source 11/28/20 1556 Oral     SpO2 11/28/20 1556 100 %  Weight --      Height --      Head Circumference --      Peak Flow --      Pain Score 11/28/20 1551 10     Pain Loc --      Pain Edu? --      Excl. in [place]? --    No data found.  Updated Vital Signs BP (!) 137/54 (BP Location: Left Arm)   Pulse (!) 54   Temp 98.3 F (36.8 C) (Oral)   Resp 18   LMP 10/27/2020   SpO2 100%   Visual Acuity Right Eye Distance:   Left Eye Distance:   Bilateral Distance:    Right Eye Near:   Left Eye Near:    Bilateral Near:     Physical Exam Vitals and nursing note reviewed.  Constitutional:      General: She is not in acute distress.    Appearance: Normal appearance. She is not ill-appearing, toxic-appearing or diaphoretic.  HENT:     Head: Normocephalic and atraumatic.  Eyes:     General: Lids are normal. Lids are everted, no foreign bodies  appreciated. Vision grossly intact. Gaze aligned appropriately. No allergic shiner or visual field deficit.       Left eye: Discharge present.No foreign body or hordeolum.     Extraocular Movements: Extraocular movements intact.     Conjunctiva/sclera:     Left eye: Left conjunctiva is injected.  Cardiovascular:     Rate and Rhythm: Normal rate.     Pulses: Normal pulses.  Pulmonary:     Effort: Pulmonary effort is normal.  Abdominal:     General: Abdomen is flat.  Musculoskeletal:        General: Normal range of motion.     Cervical back: Normal range of motion.  Skin:    General: Skin is warm and dry.  Neurological:     General: No focal deficit present.     Mental Status: She is alert and oriented to person, place, and time.  Psychiatric:        Mood and Affect: Mood normal.     UC Treatments / Results  Labs (all labs ordered are listed, but only abnormal results are displayed) Labs Reviewed - No data to display  EKG   Radiology No results found.  Procedures Procedures (including critical care time)  Medications Ordered in UC Medications - No data to display  Initial Impression / Assessment and Plan / UC Course  I have reviewed the triage vital signs and the nursing notes.  Pertinent labs & imaging results that were available during my care of the patient were reviewed by me and considered in my medical decision making (see chart for details).    Assessment negative for red flags or concerns.  Likely herpetic conjunctivitis of the left eye.  Will treat with valacyclovir 3 times a day for the next 7 days.  May take Tylenol and or ibuprofen as needed.  Follow-up for reevaluation with primary care. Final Clinical Impressions(s) / UC Diagnoses   Final diagnoses:  Herpes  Left eye pain     Discharge Instructions      Take the Valtrex three times a day for the next 7 days.   You can take Tylenol and/or Ibuprofen as needed for pain relief and fever reduction.    Return or go to the Emergency Department if symptoms worsen or do not improve in the next few days.  ED Prescriptions     Medication Sig Dispense Auth. Provider   valACYclovir (VALTREX) 1000 MG tablet Take 1 tablet (1,000 mg total) by mouth 3 (three) times daily for 7 days. 21 tablet Pearson Forster, NP      PDMP not reviewed this encounter.   Pearson Forster, NP 11/28/20 [phone]

## 2020-11-28 NOTE — Discharge Instructions (Signed)
Take the Valtrex three times a day for the next 7 days.   You can take Tylenol and/or Ibuprofen as needed for pain relief and fever reduction.   Return or go to the Emergency Department if symptoms worsen or do not improve in the next few days.

## 2020-11-28 NOTE — ED Triage Notes (Signed)
Pt presents with redness, itching of L eye starting yesterday.  States she has h/o herpes simplex in eye.

## 2020-12-03 ENCOUNTER — Encounter: Attending: Registered" | Primary: Student in an Organized Health Care Education/Training Program

## 2020-12-10 ENCOUNTER — Encounter: Attending: Internal Medicine | Primary: Student in an Organized Health Care Education/Training Program

## 2020-12-24 ENCOUNTER — Encounter
Payer: PRIVATE HEALTH INSURANCE | Attending: Obstetrics | Primary: Student in an Organized Health Care Education/Training Program

## 2020-12-24 ENCOUNTER — Encounter
Payer: PRIVATE HEALTH INSURANCE | Attending: Family | Primary: Student in an Organized Health Care Education/Training Program

## 2021-01-23 ENCOUNTER — Encounter (HOSPITAL_COMMUNITY): Payer: Self-pay | Admitting: *Deleted

## 2021-01-23 ENCOUNTER — Other Ambulatory Visit: Payer: Self-pay

## 2021-01-23 ENCOUNTER — Ambulatory Visit (HOSPITAL_COMMUNITY)
Admission: EM | Admit: 2021-01-23 | Discharge: 2021-01-23 | Disposition: A | Discharge status: Home / Self Care | Payer: BLUE CROSS/BLUE SHIELD | Attending: Urgent Care | Admitting: Urgent Care

## 2021-01-23 DIAGNOSIS — J45901 Unspecified asthma with (acute) exacerbation: Secondary | ICD-10-CM | POA: Diagnosis not present

## 2021-01-23 MED ORDER — ALBUTEROL SULFATE (2.5 MG/3ML) 0.083% IN NEBU
2.5000 mg | INHALATION_SOLUTION | Freq: Once | RESPIRATORY_TRACT | Status: AC
  Administered 2021-01-23: 16:00:00 2.5 mg via RESPIRATORY_TRACT

## 2021-01-23 MED ORDER — METHYLPREDNISOLONE SODIUM SUCC 125 MG IJ SOLR
80.0000 mg | Freq: Once | INTRAMUSCULAR | Status: AC
  Administered 2021-01-23: 18:00:00 80 mg via INTRAMUSCULAR

## 2021-01-23 MED ORDER — ALBUTEROL SULFATE (2.5 MG/3ML) 0.083% IN NEBU: 2.5000 mg | INHALATION_SOLUTION | Freq: Four times a day (QID) | RESPIRATORY_TRACT | 0 refills | Status: DC | PRN

## 2021-01-23 MED ORDER — ALBUTEROL SULFATE HFA 108 (90 BASE) MCG/ACT IN AERS: 1.0000 | INHALATION_SPRAY | Freq: Four times a day (QID) | RESPIRATORY_TRACT | 0 refills | Status: DC | PRN

## 2021-01-23 MED ORDER — METHYLPREDNISOLONE 4 MG PO TBPK: ORAL_TABLET | ORAL | 0 refills | Status: DC

## 2021-01-23 MED ORDER — METHYLPREDNISOLONE SODIUM SUCC 125 MG IJ SOLR
INTRAMUSCULAR | Status: AC
  Filled 2021-01-23: qty 2

## 2021-01-23 MED ORDER — ALBUTEROL SULFATE (2.5 MG/3ML) 0.083% IN NEBU
INHALATION_SOLUTION | RESPIRATORY_TRACT | Status: AC
  Filled 2021-01-23: qty 3

## 2021-01-23 NOTE — Discharge Instructions (Addendum)
Please go to ER or come back here if you get worse in the next 48-72 hours

## 2021-01-23 NOTE — ED Triage Notes (Signed)
Pt reports asthma flare up that started yesterday. Pt has used all of her 2 rescue inhalers and still has SHOB. SPO2 99% RA.

## 2021-01-23 NOTE — ED Provider Notes (Signed)
[facility]    CSN: [id number] Arrival date & time: 01/23/21  1549      History   Chief Complaint Chief Complaint  Patient presents with   Asthma    HPI Michelle [name] is a 30 y.o. female who presents due to having asthma flair since yesterday. She states the only thing she thinks that could be triggering it is the cold and dust from work. She is out of her rescue inhaler. Denies being ill with URI, having a fever, HA or aches. Had a neb treatment here as is feeling better.     Past Medical History:  Diagnosis Date   Asthma    Herpes simplex conjunctivitis of both eyes     There are no problems to display for this patient.   Past Surgical History:  Procedure Laterality Date   FRACTURE SURGERY      OB History   No obstetric history on file.      Home Medications    Prior to Admission medications   Medication Sig Start Date End Date Taking? Authorizing Provider  albuterol (PROVENTIL) (2.5 MG/3ML) 0.083% nebulizer solution Take 3 mLs (2.5 mg total) by nebulization every 6 (six) hours as needed for wheezing or shortness of breath. 01/23/21  Yes Rodriguez-Southworth, Nettie Elm, PA-C  methylPREDNISolone (MEDROL DOSEPAK) 4 MG TBPK tablet Take as directed 01/23/21  Yes Rodriguez-Southworth, Nettie Elm, PA-C  albuterol (PROAIR HFA) 108 (90 Base) MCG/ACT inhaler Inhale 1-2 puffs into the lungs every 6 (six) hours as needed for wheezing or shortness of breath. 01/23/21   Rodriguez-Southworth, Nettie Elm, PA-C  cetirizine (ZYRTEC) 10 MG tablet Take 1 tablet (10 mg total) by mouth daily. 12/11/19   Georgetta Haber, NP  Cetirizine HCl 10 MG CAPS Take 1 capsule (10 mg total) by mouth daily. 12/11/19 01/10/20  Georgetta Haber, NP    Family History Family History  Problem Relation Age of Onset   Healthy Mother    Healthy Father     Social History Social History   Tobacco Use   Smoking status: Never   Smokeless tobacco: Never  Vaping Use   Vaping Use: Never used   Substance Use Topics   Alcohol use: Yes   Drug use: Yes    Types: Marijuana     Allergies   Patient has no active allergies.   Review of Systems Review of Systems  Constitutional:  Negative for chills, diaphoresis and fever.  HENT:  Positive for postnasal drip. Negative for congestion, ear discharge, ear pain, rhinorrhea and sore throat.   Respiratory:  Positive for cough, shortness of breath and wheezing.   Musculoskeletal:  Negative for myalgias.  Skin:  Negative for rash.  Neurological:  Negative for headaches.    Physical Exam Triage Vital Signs ED Triage Vitals  Enc Vitals Group     BP --      Pulse Rate 01/23/21 1606 88     Resp 01/23/21 1606 (!) 22     Temp 01/23/21 1606 99.1 F (37.3 C)     Temp src --      SpO2 01/23/21 1604 99 %     Weight --      Height --      Head Circumference --      Peak Flow --      Pain Score 01/23/21 1604 0     Pain Loc --      Pain Edu? --      Excl. in [place]? --  No data found.  Updated Vital Signs Pulse 72    Temp 99.1 F (37.3 C)    Resp (!) 22    LMP 12/27/2020    SpO2 100%   Visual Acuity Right Eye Distance:   Left Eye Distance:   Bilateral Distance:    Right Eye Near:   Left Eye Near:    Bilateral Near:       Physical Exam Constitutional:      General: He is not in acute distress.    Appearance: He is not toxic-appearing.  HENT:     Head: Normocephalic.     Right Ear: external ear normal.     Left Ear: external ear normal.    Eyes:     General: No scleral icterus.    Conjunctiva/sclera: Conjunctivae normal.  Cardiovascular:     Rate and Rhythm: Normal rate and regular rhythm.     Heart sounds: No murmur heard.   Pulmonary:     Effort: Pulmonary effort is normal. No respiratory distress.     Breath sounds: Wheezing present throughout. 80% improved after Solumedrol IM injection.     Musculoskeletal:        General: Normal range of motion.     Cervical back: Neck supple.  Lymphadenopathy:      Cervical: No cervical adenopathy.  Skin:    General: Skin is warm and dry.     Findings: No rash.  Neurological:     Mental Status: She is alert and oriented to person, place, and time.     Gait: Gait normal.  Psychiatric:        Mood and Affect: Mood normal.        Behavior: Behavior normal.        Thought Content: Thought content normal.        Judgment: Judgment normal.   UC Treatments / Results  Labs (all labs ordered are listed, but only abnormal results are displayed) Labs Reviewed - No data to display  EKG   Radiology No results found.  Procedures Procedures (including critical care time)  Medications Ordered in UC Medications  albuterol (PROVENTIL) (2.5 MG/3ML) 0.083% nebulizer solution 2.5 mg (2.5 mg Nebulization Given 01/23/21 1617)  methylPREDNISolone sodium succinate (SOLU-MEDROL) 125 mg/2 mL injection 80 mg (80 mg Intramuscular Given 01/23/21 1829)    Initial Impression / Assessment and Plan / UC Course  I have reviewed the triage vital signs and the nursing notes. Asthma exacerbation She was given Solumedrol 80 mg IM here. She left with resolved SOB and improved wheezing.  I refilled her albuterol and placed her on Medrol which she should start tomorrow. See instructions    Final Clinical Impressions(s) / UC Diagnoses   Final diagnoses:  Severe asthma with acute exacerbation, unspecified whether persistent     Discharge Instructions      Please go to ER or come back here if you get worse in the next 48-72 hours      ED Prescriptions     Medication Sig Dispense Auth. Provider   albuterol (PROAIR HFA) 108 (90 Base) MCG/ACT inhaler Inhale 1-2 puffs into the lungs every 6 (six) hours as needed for wheezing or shortness of breath. 1 each Rodriguez-Southworth, Sunday Spillers, PA-C   albuterol (PROVENTIL) (2.5 MG/3ML) 0.083% nebulizer solution Take 3 mLs (2.5 mg total) by nebulization every 6 (six) hours as needed for wheezing or shortness of breath. 75 mL  Rodriguez-Southworth, Jozef Eisenbeis, PA-C   methylPREDNISolone (MEDROL DOSEPAK) 4 MG TBPK tablet Take as  directed 21 tablet Rodriguez-Southworth, Sunday Spillers, PA-C      PDMP not reviewed this encounter.   Shelby Mattocks, [place] 01/23/21 1853

## 2021-01-27 NOTE — L&D Delivery Note (Signed)
OB/GYN Faculty Practice Delivery Note  Michelle [name] is a 31 y.o. G6P0050 s/p SVD at [redacted]w[redacted]d. She was admitted for spontaneous onset of labor.   ROM: 6h 34m with clear fluid GBS Status: Positive, treated with PCN Maximum Maternal Temperature: 98.6  Labor Progress: Pt arrived to [facility] in active labor and progressed to 6cm, then AROM performed. She remained 7cm at which point her labor pattern indicated a need for pitocin augmentation and then she progressed to complete with intense pressure.  Delivery Date/Time: 09/29/21 at 1910 Delivery: Called to room and patient was complete and ready to push. Head delivered LOA. No nuchal cord present. Shoulder and body delivered in usual fashion. Infant with spontaneous cry, placed on mother's abdomen, dried and stimulated. Cord clamped x 2 after 1-minute delay, and cut by MOB. Cord blood drawn. Placenta delivered spontaneously, intact, with 3-vessel cord. Fundus firm with massage and Pitocin. Labia, perineum, vagina, and cervix inspected, small left perineal laceration found and repaired with 3.0 vicryl.   Placenta: Spontaneous, intact, sent to L&D for disposal Complications: None Lacerations: 1st degree perineal EBL: 150 Analgesia: Epidural  Postpartum Planning [x]  transfer orders to MB [x]  discharge summary started & shared [x]  message to sent to schedule follow-up  [x]  lists updated [x]  vaccines UTD  Infant: Girl  APGARs 9/9  3410g  , CNM, IBCLC Certified Nurse Midwife, [facility] for , [facility] 09/29/2021, 8:25 PM

## 2021-03-08 ENCOUNTER — Other Ambulatory Visit: Payer: Self-pay

## 2021-03-08 ENCOUNTER — Encounter (HOSPITAL_COMMUNITY): Payer: Self-pay

## 2021-03-08 ENCOUNTER — Ambulatory Visit (HOSPITAL_COMMUNITY)
Admission: RE | Admit: 2021-03-08 | Discharge: 2021-03-08 | Disposition: A | Discharge status: Home / Self Care | Payer: BLUE CROSS/BLUE SHIELD | Source: Ambulatory Visit | Attending: Internal Medicine | Admitting: Internal Medicine

## 2021-03-08 VITALS — BP 112/70 | HR 59 | Temp 99.1°F | Resp 14 | Wt 133.6 lb

## 2021-03-08 DIAGNOSIS — Z113 Encounter for screening for infections with a predominantly sexual mode of transmission: Secondary | ICD-10-CM

## 2021-03-08 DIAGNOSIS — N898 Other specified noninflammatory disorders of vagina: Secondary | ICD-10-CM

## 2021-03-08 DIAGNOSIS — Z3201 Encounter for pregnancy test, result positive: Secondary | ICD-10-CM | POA: Diagnosis present

## 2021-03-08 DIAGNOSIS — Z8709 Personal history of other diseases of the respiratory system: Secondary | ICD-10-CM | POA: Diagnosis present

## 2021-03-08 LAB — POCT URINALYSIS DIPSTICK, ED / UC
Bilirubin Urine: NEGATIVE
Glucose, UA: NEGATIVE mg/dL
Hgb urine dipstick: NEGATIVE
Ketones, ur: NEGATIVE mg/dL
Nitrite: NEGATIVE
Protein, ur: NEGATIVE mg/dL
Specific Gravity, Urine: 1.02 (ref 1.005–1.030)
Urobilinogen, UA: 0.2 mg/dL (ref 0.0–1.0)
pH: 8.5 — ABNORMAL HIGH (ref 5.0–8.0)

## 2021-03-08 LAB — HEPATITIS C ANTIBODY: HCV Ab: NONREACTIVE

## 2021-03-08 LAB — POC URINE PREG, ED: Preg Test, Ur: POSITIVE — AB

## 2021-03-08 LAB — HIV ANTIBODY (ROUTINE TESTING W REFLEX): HIV Screen 4th Generation wRfx: NONREACTIVE

## 2021-03-08 MED ORDER — ALBUTEROL SULFATE HFA 108 (90 BASE) MCG/ACT IN AERS: 1.0000 | INHALATION_SPRAY | Freq: Four times a day (QID) | RESPIRATORY_TRACT | 0 refills | Status: DC | PRN

## 2021-03-08 NOTE — ED Triage Notes (Signed)
Pt presents with vaginal discharge and odor x 1 week. Pt states she found out she was pregnant via home pregnancy test two days ago. Pt requesting UA and STI screening. Pt states she alsop needs referral for OBGYN and refill on albuterol inhaler.

## 2021-03-08 NOTE — ED Provider Notes (Signed)
[place]    CSN: [id number] Arrival date & time: 03/08/21  1057      History   Chief Complaint Chief Complaint  Patient presents with   Vaginal Discharge   vaginal odor    HPI Michelle [name] is a 31 y.o. female.   Patient presents today requesting pregnancy test.  She did an at home pregnancy test that was +2 days ago.  Her LMP was sometime the middle to end of December but she does not remember the exact date.  She is not on any birth control.  She has been pregnant before but not given birth.  She is not taking any medication on a regular basis but does request a refill of albuterol inhaler to have on hand for shortness of breath associated with asthma.  She occasionally drinks alcohol but stopped drinking since finding out she is pregnant.  She does not smoke cigarettes.  She occasionally smokes marijuana.  She denies any urinary symptoms including frequency, urgency, pelvic pain, abdominal pain, dysuria.  She does report vaginal odor for the past week with associated discharge.  She denies any new sexual partners, new medications, changes to personal hygiene products including soaps or detergents.   Past Medical History:  Diagnosis Date   Asthma    Herpes simplex conjunctivitis of both eyes     There are no problems to display for this patient.   Past Surgical History:  Procedure Laterality Date   FRACTURE SURGERY      OB History     Gravida  1   Para      Term      Preterm      AB      Living         SAB      IAB      Ectopic      Multiple      Live Births               Home Medications    Prior to Admission medications   Medication Sig Start Date End Date Taking? Authorizing Provider  albuterol (PROAIR HFA) 108 (90 Base) MCG/ACT inhaler Inhale 1-2 puffs into the lungs every 6 (six) hours as needed for wheezing or shortness of breath. 03/08/21   Antrice Pal, Derry Skill, PA-C    Family History Family History  Problem Relation Age  of Onset   Healthy Mother    Healthy Father     Social History Social History   Tobacco Use   Smoking status: Never   Smokeless tobacco: Never  Vaping Use   Vaping Use: Never used  Substance Use Topics   Alcohol use: Yes   Drug use: Yes    Types: Marijuana     Allergies   Patient has no known allergies.   Review of Systems Review of Systems  Constitutional:  Positive for activity change. Negative for appetite change, fatigue and fever.  Respiratory:  Negative for cough and shortness of breath.   Cardiovascular:  Negative for chest pain.  Gastrointestinal:  Negative for abdominal pain, diarrhea, nausea and vomiting.  Genitourinary:  Positive for menstrual problem and vaginal discharge. Negative for dysuria, flank pain, frequency, urgency, vaginal bleeding and vaginal pain.  Neurological:  Negative for dizziness, light-headedness and headaches.    Physical Exam Triage Vital Signs ED Triage Vitals  Enc Vitals Group     BP 03/08/21 1108 112/70     Pulse Rate 03/08/21 1108 (!) 59  Resp 03/08/21 1108 14     Temp 03/08/21 1108 99.1 F (37.3 C)     Temp Source 03/08/21 1108 Oral     SpO2 03/08/21 1108 98 %     Weight 03/08/21 1110 122 lb (55.3 kg)     Height --      Head Circumference --      Peak Flow --      Pain Score 03/08/21 1110 0     Pain Loc --      Pain Edu? --      Excl. in [place]? --    No data found.  Updated Vital Signs BP 112/70 (BP Location: Right Arm)    Pulse (!) 59    Temp 99.1 F (37.3 C) (Oral)    Resp 14    Wt 133 lb 9.6 oz (60.6 kg)    LMP 12/31/2020    SpO2 98%    BMI 23.67 kg/m   Visual Acuity Right Eye Distance:   Left Eye Distance:   Bilateral Distance:    Right Eye Near:   Left Eye Near:    Bilateral Near:     Physical Exam Vitals reviewed.  Constitutional:      General: She is awake. She is not in acute distress.    Appearance: Normal appearance. She is well-developed. She is not ill-appearing.     Comments: Very pleasant  female appears stated age in no acute distress sitting comfortably in exam room  HENT:     Head: Normocephalic and atraumatic.  Cardiovascular:     Rate and Rhythm: Normal rate and regular rhythm.     Heart sounds: Normal heart sounds, S1 normal and S2 normal. No murmur heard. Pulmonary:     Effort: Pulmonary effort is normal.     Breath sounds: Normal breath sounds. No wheezing, rhonchi or rales.     Comments: Clear to auscultation bilaterally Abdominal:     General: Bowel sounds are normal.     Palpations: Abdomen is soft.     Tenderness: There is no abdominal tenderness. There is no right CVA tenderness, left CVA tenderness, guarding or rebound.     Comments: Benign abdominal exam  Psychiatric:        Behavior: Behavior is cooperative.     UC Treatments / Results  Labs (all labs ordered are listed, but only abnormal results are displayed) Labs Reviewed  POCT URINALYSIS DIPSTICK, ED / UC - Abnormal; Notable for the following components:      Result Value   pH 8.5 (*)    Leukocytes,Ua TRACE (*)    All other components within normal limits  POC URINE PREG, ED - Abnormal; Notable for the following components:   Preg Test, Ur POSITIVE (*)    All other components within normal limits  URINE CULTURE  HIV ANTIBODY (ROUTINE TESTING W REFLEX)  RPR  HEPATITIS C ANTIBODY  CERVICOVAGINAL ANCILLARY ONLY    EKG   Radiology No results found.  Procedures Procedures (including critical care time)  Medications Ordered in UC Medications - No data to display  Initial Impression / Assessment and Plan / UC Course  I have reviewed the triage vital signs and the nursing notes.  Pertinent labs & imaging results that were available during my care of the patient were reviewed by me and considered in my medical decision making (see chart for details).     Urine pregnancy test was negative in clinic today.  Patient was provided information for OB/GYN encouraged  to call to schedule an  appointment as soon as possible.  UA showed trace leukocyte esterase but she denies any significant symptoms of UTI.  Culture was obtained but will defer antibiotics until culture results are obtained.  STI swab collected today-results pending.  HIV/hepatitis/syphilis testing also obtained.  We will determine treatment based on laboratory results.  She is to abstain from drug use, tobacco, alcohol.  Discussed that she should not use over-the-counter medications except Tylenol.  Encouraged her to start prenatal vitamins.  Discussed that she should avoid eating anything that is raw/uncooked.  She is also to avoid raising her core body temperature by avoiding saunas and hot tubs.  Discussed that if she develops any abdominal pain, abnormal bleeding, nausea/vomiting she needs to be seen immediately in the emergency room.  Strict return precautions given to which she expressed understanding.  Final Clinical Impressions(s) / UC Diagnoses   Final diagnoses:  Positive pregnancy test  Vaginal discharge  History of asthma  Routine screening for STI (sexually transmitted infection)     Discharge Instructions      Your urine pregnancy test was positive.  Please call the OB/GYN to schedule an appointment soon as possible.  As we discussed, you should avoid alcohol, drugs, tobacco.  You should not take over-the-counter medications with the exception of occasional Tylenol.  You can use your albuterol inhaler as needed.  Make sure all of your food is well cooked and avoid anything that is raw.  Start a prenatal vitamin.  Avoid hot tubs and saunas.  If you develop any abdominal pain, pelvic pain, bleeding, nausea/vomiting you need to be seen immediately.  Monitor your MyChart for your lab results.  We will contact you if we need to arrange any treatment.     ED Prescriptions     Medication Sig Dispense Auth. Provider   albuterol (PROAIR HFA) 108 (90 Base) MCG/ACT inhaler Inhale 1-2 puffs into the lungs every 6  (six) hours as needed for wheezing or shortness of breath. 8 g Rynn Markiewicz, Derry Skill, PA-C      PDMP not reviewed this encounter.   Terrilee Croak, PA-C 03/08/21 1139

## 2021-03-08 NOTE — Discharge Instructions (Signed)
Your urine pregnancy test was positive.  Please call the OB/GYN to schedule an appointment soon as possible.  As we discussed, you should avoid alcohol, drugs, tobacco.  You should not take over-the-counter medications with the exception of occasional Tylenol.  You can use your albuterol inhaler as needed.  Make sure all of your food is well cooked and avoid anything that is raw.  Start a prenatal vitamin.  Avoid hot tubs and saunas.  If you develop any abdominal pain, pelvic pain, bleeding, nausea/vomiting you need to be seen immediately.  Monitor your MyChart for your lab results.  We will contact you if we need to arrange any treatment.

## 2021-03-09 LAB — URINE CULTURE: Culture: 20000 — AB

## 2021-03-09 LAB — RPR: RPR Ser Ql: NONREACTIVE

## 2021-03-12 ENCOUNTER — Telehealth (HOSPITAL_COMMUNITY): Payer: Self-pay | Admitting: Emergency Medicine

## 2021-03-12 LAB — CERVICOVAGINAL ANCILLARY ONLY

## 2021-03-12 MED ORDER — AMOXICILLIN 500 MG PO CAPS: 500.0000 mg | ORAL_CAPSULE | Freq: Two times a day (BID) | ORAL | 0 refills | Status: AC

## 2021-03-26 ENCOUNTER — Telehealth (INDEPENDENT_AMBULATORY_CARE_PROVIDER_SITE_OTHER): Payer: BLUE CROSS/BLUE SHIELD | Admitting: *Deleted

## 2021-03-26 DIAGNOSIS — Z3687 Encounter for antenatal screening for uncertain dates: Secondary | ICD-10-CM

## 2021-03-26 DIAGNOSIS — Z3A00 Weeks of gestation of pregnancy not specified: Secondary | ICD-10-CM

## 2021-03-26 DIAGNOSIS — Z5941 Food insecurity: Secondary | ICD-10-CM

## 2021-03-26 DIAGNOSIS — Z3490 Encounter for supervision of normal pregnancy, unspecified, unspecified trimester: Secondary | ICD-10-CM | POA: Insufficient documentation

## 2021-03-26 NOTE — Patient Instructions (Signed)
  At our [facility], we work as an integrated team, providing care to address both physical and emotional health. Your medical provider may refer you to see our Behavioral Health Clinician (BHC) on the same day you see your medical provider, as availability permits; often scheduled virtually at your convenience.  Our BHC is available to all patients, visits generally last between 20-30 minutes, but can be longer or shorter, depending on patient need. The BHC offers help with stress management, coping with symptoms of depression and anxiety, major life changes , sleep issues, changing risky behavior, grief and loss, life stress, working on personal life goals, and  behavioral health issues, as these all affect your overall health and wellness.  The BHC is NOT available for the following: FMLA paperwork, court-ordered evaluations, specialty assessments (custody or disability), letters to employers, or obtaining certification for an emotional support animal. The BHC does not provide long-term therapy. You have the right to refuse integrated behavioral health services, or to reschedule to see the BHC at a later date.  Confidentiality exception: If it is suspected that a child or disabled adult is being abused or neglected, we are required by law to report that to either Child Protective Services or Adult Protective Services.  If you have a diagnosis of Bipolar affective disorder, Schizophrenia, or recurrent Major depressive disorder, we will recommend that you establish care with a psychiatrist, as these are lifelong, chronic conditions, and we want your overall emotional health and medications to be more closely monitored. If you anticipate needing extended maternity leave due to mental health issues postpartum, it it recommended you inform your medical provider, so we can put in a referral to a psychiatrist as soon as possible. The BHC is unable to recommend an extended maternity leave for mental  health issues. Your medical provider or BHC may refer you to a therapist for ongoing, traditional therapy, or to a psychiatrist, for medication management, if it would benefit your overall health. Depending on your insurance, you may have a copay or be charged a deductible, depending on your insurance, to see the BHC. If you are uninsured, it is recommended that you apply for financial assistance. (Forms may be requested at the front desk for in-person visits, via MyChart, or request a form during a virtual visit).  If you see the BHC more than 6 times, you will have to complete a comprehensive clinical assessment interview with the BHC to resume integrated services.  For virtual visits with the BHC, you must be physically in the state of [place] at the time of the visit. For example, if you live in [place], you will have to do an in-person visit with the BHC, and your out-of-state insurance may not cover behavioral health services in [place]. If you are going out of the state or country for any reason, the BHC may see you virtually when you return to [place], but not while you are physically outside of .    

## 2021-03-26 NOTE — Progress Notes (Signed)
New OB Intake  I connected with  Michelle [name] on 03/26/21 at 135 by MyChart Video Visit and verified that I am speaking with the correct person using two identifiers. Nurse is located at [facility] and pt is located at home.  I discussed the limitations, risks, security and privacy concerns of performing an evaluation and management service by telephone and the availability of in person appointments. I also discussed with the patient that there may be a patient responsible charge related to this service. The patient expressed understanding and agreed to proceed.  I explained I am completing New OB Intake today. We discussed her EDD that is undetermined due to unsure of LMP- sometime in middle to end of December. Dating [place] ordered for first available on 04/02/21.  Pt is G6/P0050. I reviewed her allergies, medications, Medical/Surgical/OB history, and appropriate screenings. I informed her of [facility] services. Based on history, this is a/an  pregnancy uncomplicated .   Patient Active Problem List   Diagnosis Date Noted   Supervision of low-risk pregnancy 03/26/2021    Concerns addressed today  Delivery Plans:  Plans to deliver at [facility].   MyChart/Babyscripts MyChart access verified. I explained pt will have some visits in office and some virtually. Babyscripts instructions given and order placed.   Blood Pressure Cuff  Patient has private insurance; instructed to purchase blood pressure cuff and bring to first prenatal appt. Explained after first prenatal appt pt will check weekly and document in Babyscripts.  Weight scale: Patient does not  have weight scale. Instructed to purchase weight scale if she can.   Anatomy US Explained first scheduled [place] will be around 19 weeks and will be scheduled once we confirm her EDD with her dating [place].    Labs Discussed Avelina Laine genetic screening with patient. Would like both Panorama and Horizon drawn at new OB visit.Also informed  patient she will need AFP  15-21 weeks to complete genetic testing .Routine prenatal labs needed.  Covid Vaccine Patient has not had covid vaccine.   Is patient a CenteringPregnancy candidate? Declined   Is patient a Mom+Baby Combined Care candidate? Accepted     Informed patient of [facility] website  and placed link in her AVS.   Social Determinants of Health Food Insecurity: Patient expresses food insecurity. Food Market information given to patient; explained patient may visit at the end of first OB appointment. WIC Referral: Patient is interested in referral to [facility].  Transportation: Patient denies transportation needs. Childcare: Discussed no children allowed at ultrasound appointments. Offered childcare services; patient declines childcare services at this time.  Send link to Pregnancy Navigators   Placed OB Box on problem list and updated  First visit review I reviewed new OB appt with pt. I explained she will have a pelvic exam, ob bloodwork with genetic screening, and PAP smear. Explained pt will be seen by Dr. Alysia Penna at first visit; encounter routed to appropriate provider. Explained that patient will be seen by pregnancy navigator following visit with provider. [facility] information placed in AVS.   Nancy Fetter 03/26/2021  5:21 PM

## 2021-03-27 ENCOUNTER — Ambulatory Visit (INDEPENDENT_AMBULATORY_CARE_PROVIDER_SITE_OTHER): Payer: BLUE CROSS/BLUE SHIELD | Admitting: Family

## 2021-03-27 ENCOUNTER — Encounter: Payer: Self-pay | Admitting: Family

## 2021-03-27 ENCOUNTER — Other Ambulatory Visit: Payer: Self-pay

## 2021-03-27 VITALS — BP 102/60 | HR 75 | Temp 97.9°F | Ht 63.0 in | Wt 137.0 lb

## 2021-03-27 DIAGNOSIS — J4520 Mild intermittent asthma, uncomplicated: Secondary | ICD-10-CM | POA: Diagnosis not present

## 2021-03-27 DIAGNOSIS — Z331 Pregnant state, incidental: Secondary | ICD-10-CM | POA: Diagnosis not present

## 2021-03-27 DIAGNOSIS — Z3491 Encounter for supervision of normal pregnancy, unspecified, first trimester: Secondary | ICD-10-CM

## 2021-03-27 NOTE — Progress Notes (Signed)
? ?New Patient Office Visit ? ?Subjective:  ?Patient ID: Michelle [name], female    DOB: 07/09/1990  Age: 31 y.o. MRN: [id number] ? ?CC:  ?Chief Complaint  ?Patient presents with  ? Establish Care  ?  New pregnancy; 03/08/2021.  ? ? ?HPI ?Michelle [name] presents for establishing care and to discuss one problem and a new pregnancy. ? ?1st trimester pregnancy - almost [redacted] weeks pregnant. Has established with OB virtually and has an office visit scheduled for 04/02/2021. This is her first pregancy. She reports mild nausea, no vaginal bleeding. ?Asthma: Patient presents for asthma follow-up. She is not currently in exacerbation. Symptoms currently include dyspnea, non-productive cough, and wheezing and occur weekly.  Observed precipitants include cold air and pollens.  Current limitations in activity from asthma: none.  Number of days of school or work missed in the last month: 0. Frequency of use of quick-relief meds: every other day, one time. The patient reports adherence to this regimen. ? ?Past Medical History:  ?Diagnosis Date  ? Asthma   ? Herpes simplex conjunctivitis of both eyes   ? ? ?Past Surgical History:  ?Procedure Laterality Date  ? FRACTURE SURGERY Right   ? leg  ? ? ?Family History  ?Adopted: Yes  ?Problem Relation Age of Onset  ? Healthy Mother   ? Healthy Father   ? ? ?Social History  ? ?Socioeconomic History  ? Marital status: Significant Other  ?  Spouse name: Not on file  ? Number of children: Not on file  ? Years of education: Not on file  ? Highest education level: Not on file  ?Occupational History  ? Not on file  ?Tobacco Use  ? Smoking status: Never  ? Smokeless tobacco: Never  ?Vaping Use  ? Vaping Use: Never used  ?Substance and Sexual Activity  ? Alcohol use: Yes  ?  Comment: daily until stopped when found our ptregnant  ? Drug use: Yes  ?  Types: Marijuana  ?  Comment: 03/26/21 uses seldom  ? Sexual activity: Yes  ?  Birth control/protection: None  ?Other Topics Concern  ? Not on file  ?Social  History Narrative  ? Not on file  ? ?Social Determinants of Health  ? ?Financial Resource Strain: Not on file  ?Food Insecurity: Food Insecurity Present  ? Worried About Programme researcher, broadcasting/film/video in the Last Year: Often true  ? Ran Out of Food in the Last Year: Often true  ?Transportation Needs: No Transportation Needs  ? Lack of Transportation (Medical): No  ? Lack of Transportation (Non-Medical): No  ?Physical Activity: Not on file  ?Stress: Not on file  ?Social Connections: Not on file  ?Intimate Partner Violence: Not on file  ? ? ?Objective:  ? ?Today's Vitals: BP 102/60   Pulse 75   Temp 97.9 ?F (36.6 ?C) (Temporal)   Ht 5\' 3"  (1.6 m)   Wt 137 lb (62.1 kg)   LMP  (LMP Unknown)   SpO2 99%   BMI 24.27 kg/m?  ? ?Physical Exam ?Vitals and nursing note reviewed.  ?Constitutional:   ?   Appearance: Normal appearance.  ?Cardiovascular:  ?   Rate and Rhythm: Normal rate and regular rhythm.  ?Pulmonary:  ?   Effort: Pulmonary effort is normal.  ?   Breath sounds: Normal breath sounds.  ?Musculoskeletal:     ?   General: Normal range of motion.  ?Skin: ?   General: Skin is warm and dry.  ?Neurological:  ?  Mental Status: She is alert.  ?Psychiatric:     ?   Mood and Affect: Mood normal.     ?   Behavior: Behavior normal.  ? ? ?Assessment & Plan:  ? ?Problem List Items Addressed This Visit   ? ?  ? Respiratory  ? Mild intermittent asthma  ?  Chronic - stable for now with rescue, though says she is using about qod 1-2x/day due to allergy season. Advised pt to let me know if having to use rescue daily. ?  ?  ? ?Other Visit Diagnoses   ? ? First trimester pregnancy    -  Primary  ? positive test on 03/08/2021, pt has upcoming office appt w/OB on 3/7. Advised pt on pregnancy precautions. Reports mild nausea, no vaginal bleeding or pain. ?  ? ? ?Outpatient Encounter Medications as of 03/27/2021  ?Medication Sig  ? albuterol (PROAIR HFA) 108 (90 Base) MCG/ACT inhaler Inhale 1-2 puffs into the lungs every 6 (six) hours as needed  for wheezing or shortness of breath.  ? Prenatal Vit-Fe Fumarate-FA (PRENATAL VITAMIN PO) Take 1 tablet by mouth daily.  ? ?No facility-administered encounter medications on file as of 03/27/2021.  ? ? ?Follow-up: No follow-ups on file.  ? ?Dulce Sellar, NP ?

## 2021-03-27 NOTE — Assessment & Plan Note (Addendum)
Chronic - stable for now with rescue, though says she is using about qod 1-2x/day due to allergy season. Advised pt to let me know if having to use rescue daily. ?

## 2021-03-27 NOTE — Patient Instructions (Addendum)
Welcome to [facility]! It was a pleasure meeting you today. ? ?For your allergies, it is safe for you to take generic Zyrtec or Xyzal over the counter. Do NOT take any prednisone. ? ?Good luck with your pregnancy, congratulations! ? ? ? ?PLEASE NOTE: ? ?If you had any LAB tests please let us know if you have not heard back within a few days. You may see your results on MyChart before we have a chance to review them but we will give you a call once they are reviewed by [place]. If we ordered any REFERRALS today, please let us know if you have not heard from their office within the next week.  ?Let us know through MyChart if you are needing REFILLS, or have your pharmacy send [place] the request. You can also use MyChart to communicate with me or any office staff. ? ?Please try these tips to maintain a healthy lifestyle: ? ?Eat most of your calories during the day when you are active. Eliminate processed foods including packaged sweets (pies, cakes, cookies), reduce intake of potatoes, white bread, white pasta, and white rice. Look for whole grain options, oat flour or almond flour. ? ?Each meal should contain half fruits/vegetables, one quarter protein, and one quarter carbs (no bigger than a computer mouse). ? ?Cut down on sweet beverages. This includes juice, soda, and sweet tea. Also watch fruit intake, though this is a healthier sweet option, it still contains natural sugar! Limit to 3 servings daily. ? ?Drink at least 1 glass of water with each meal and aim for at least 8 glasses per day ? ?Exercise at least 150 minutes every week.  ? ?

## 2021-04-02 ENCOUNTER — Other Ambulatory Visit (HOSPITAL_COMMUNITY)
Admission: RE | Admit: 2021-04-02 | Discharge: 2021-04-02 | Disposition: A | Discharge status: Home / Self Care | Payer: BLUE CROSS/BLUE SHIELD | Source: Ambulatory Visit | Attending: Obstetrics and Gynecology | Admitting: Obstetrics and Gynecology

## 2021-04-02 ENCOUNTER — Other Ambulatory Visit: Payer: Self-pay

## 2021-04-02 ENCOUNTER — Ambulatory Visit (INDEPENDENT_AMBULATORY_CARE_PROVIDER_SITE_OTHER): Payer: BLUE CROSS/BLUE SHIELD | Admitting: Obstetrics and Gynecology

## 2021-04-02 ENCOUNTER — Other Ambulatory Visit: Payer: Self-pay | Admitting: Obstetrics and Gynecology

## 2021-04-02 ENCOUNTER — Ambulatory Visit
Admission: RE | Admit: 2021-04-02 | Discharge: 2021-04-02 | Disposition: A | Discharge status: Home / Self Care | Payer: BLUE CROSS/BLUE SHIELD | Source: Ambulatory Visit | Attending: Obstetrics and Gynecology | Admitting: Obstetrics and Gynecology

## 2021-04-02 ENCOUNTER — Encounter: Payer: Self-pay | Admitting: Obstetrics and Gynecology

## 2021-04-02 VITALS — BP 108/68 | HR 63 | Wt 135.7 lb

## 2021-04-02 DIAGNOSIS — Z8619 Personal history of other infectious and parasitic diseases: Secondary | ICD-10-CM

## 2021-04-02 DIAGNOSIS — Z87898 Personal history of other specified conditions: Secondary | ICD-10-CM | POA: Diagnosis not present

## 2021-04-02 DIAGNOSIS — Z3481 Encounter for supervision of other normal pregnancy, first trimester: Secondary | ICD-10-CM | POA: Insufficient documentation

## 2021-04-02 DIAGNOSIS — Z3A13 13 weeks gestation of pregnancy: Secondary | ICD-10-CM | POA: Diagnosis not present

## 2021-04-02 DIAGNOSIS — Z3492 Encounter for supervision of normal pregnancy, unspecified, second trimester: Secondary | ICD-10-CM | POA: Insufficient documentation

## 2021-04-02 DIAGNOSIS — J4520 Mild intermittent asthma, uncomplicated: Secondary | ICD-10-CM | POA: Insufficient documentation

## 2021-04-02 DIAGNOSIS — Z3687 Encounter for antenatal screening for uncertain dates: Secondary | ICD-10-CM | POA: Insufficient documentation

## 2021-04-02 DIAGNOSIS — O99511 Diseases of the respiratory system complicating pregnancy, first trimester: Secondary | ICD-10-CM | POA: Diagnosis not present

## 2021-04-02 MED ORDER — ALBUTEROL SULFATE HFA 108 (90 BASE) MCG/ACT IN AERS: 1.0000 | INHALATION_SPRAY | Freq: Four times a day (QID) | RESPIRATORY_TRACT | 1 refills | Status: DC | PRN

## 2021-04-02 MED ORDER — ALBUTEROL SULFATE HFA 108 (90 BASE) MCG/ACT IN AERS: 1.0000 | INHALATION_SPRAY | Freq: Four times a day (QID) | RESPIRATORY_TRACT | 3 refills | Status: DC | PRN

## 2021-04-02 NOTE — Patient Instructions (Addendum)
AREA PEDIATRIC/FAMILY PRACTICE PHYSICIANS  [place]outheast [place] ([address])  [facility] Chambliss, MD; Eniola, MD; Hale, MD; Hensel, MD; McDiarmid, MD; McIntyer, MD; Neal, MD; Walden, MD [address] [place], [place] [address] [phone] Mon-Fri 8:30-12:30, 1:30-5:00 Providers come to see babies at [facility] Accepting Medicaid [facility] providers who accept newborns: Koirala, MD; Morrow, MD; Wolters, MD [address], [place], [place] [address] [phone] Mon-Fri 8:00-5:30 Babies seen by providers at [facility] Does NOT accept Medicaid Please call early in hospitalization for appointment (limited availability)  [facility] Mulberry, MD [address] [place], [place] [address] [phone] Mon, Tue, Thur, Fri 8:30-5:00, Wed 10:00-7:00 (closed 1-2pm) Babies seen by [facility] providers Accepting Medicaid Rubin - Pediatrician Rubin, MD [address], [place], [place] [address] [phone] Mon-Fri 8:30-5:00, Sat 8:30-12:00 Provider comes to see babies at [facility] Accepting Medicaid Must have been referred from current patients or contacted office prior to delivery [facility] ([facility]) Brown, MD; Chandler, MD; Ettefagh, MD; Grant, MD; Lester, MD; McCormick, MD; McQueen, MD; Prose, MD; Simha, MD; Stanley, MD; Stryffeler, NP; Tebben, NP [address], [place], [place] [address] [phone] Mon, Tue, Thur, Fri 8:30-5:30, Wed 9:30-5:30, Sat 8:30-12:30 Babies seen by [facility] providers Accepting Medicaid Only accepting infants of first-time parents or siblings of current patients Hospital discharge coordinator will make follow-up appointment Jack Amos [address], [place], [place]  [address] [phone]   Fax - [phone] [facility] [address], [place], [place]  [address] Phone - [phone]   Fax - [phone] Shilpa Gosrani [address], [place], [place]  [address] [phone]  [place] ([address]) [facility] Bates, MD; [place], MD; Cooper, Cox, MD; MD; Davis, MD; Dovico, MD; Ettefaugh, MD; Little, MD; Lowe, MD; Keiffer, MD; Melvin, MD; Sumner, MD; Williams, MD [address], [place], [place] [address] [phone] Mon-Fri 8:30-5:00 (extended evenings Mon-Thur as needed), Sat-Sun 10:00-1:00 Providers come to see babies at [facility] Accepting Medicaid for families of first-time babies and families with all children in the household age 3 and under. Must register with office prior to making appointment (M-F only). [facility] Henson, NP; Knapp, MD; Lalonde, MD; Tysinger, PA [address] [place], [place] [address] [phone] Mon-Fri 8:00-5:00 Babies seen by providers at [facility] Does NOT accept Medicaid/Commercial Insurance Only [facility] [place] ([facility])  Artis, MD; Barnes, MD; Bratton, MD; Coccaro, MD; Lockett Gardner, MD; Kramer, MD; Marshall, MD; Netherton, MD; Poleto, MD; Skinner, MD [address] [place], [place] [address] [phone] Mon-Fri 8:30-5:30, Sat (Oct.-Mar.) 9:00-1:00 Babies seen by providers at [facility] Accepting Medicaid  [place] ([address]) [facility] Reid, MD; Warner, MD [address], [place], [place] [address] [phone] Mon-Fri 8:30-5:00, Sat 8:30-12:00 Providers come to see babies at [facility] Does NOT accept Medicaid [facility] Becker, PA; Hagler, MD; Scifres, PA; Sun, MD; Swayne, MD [address], [place],  [address] [phone] Mon-Fri 8:00-5:00 Babies seen by providers at [facility] Does NOT accept Medicaid Only accepting babies of parents who  are patients Please call early in hospitalization for appointment (limited availability)  Pediatricians Clark, MD; Frye, MD; Kelleher, MD; Mack, NP; Miller, MD; O'Keller, MD; Patterson, NP; Pudlo, MD; Puzio, MD; Thomas, MD; Tucker, MD; Twiselton, MD [address], [place], [place] [address] [phone] Mon-Fri 8:00-5:00, Sat 9:00-12:00 Providers come to see babies at [facility] Does NOT accept Medicaid  [place] ([address]) [facility] [employer] [facility] providers accepting new patients: Brake, NP; Wharton, PA [address], [place], [place] [address] [phone] Mon-Fri 8:00-5:00 Babies seen by providers at [facility] Does NOT accept Medicaid Only accepting babies of parents who are patients Please call early in hospitalization for appointment (limited availability) [facility] Gay, MD; Quinlan, MD [address] [place], [place] [address] [phone] (press 1 to schedule appointment) Mon-Fri 8:00-5:00 Providers come to see babies at [facility] Does NOT accept Medicaid [facility] Mazer, MD [address] [place], [place] [address] [phone] Mon-Fri 8:30-5:00 (lunch 12:30-1:00), extended hours by appointment only Wed 5:00-6:30 Babies seen by [facility] providers Accepting Medicaid [facility] Banks, MD; Jordan, MD; Koberlein, MD [address], [place], [place] [address] [phone] Mon-Fri 8:00-5:00 Babies seen by [facility] providers Does NOT accept Medicaid [facility] Parker, MD; Hunter, MD; Wallace, DO [address] [place], [place] [address] [phone] Mon-Fri 8:00-5:00 Babies seen by [facility] providers Does NOT accept Medicaid [facility] Brandon, PA; Brecken, PA; Christy, NP; Dees, MD; DeClaire, MD; DeWeese, MD; Hansen, NP; Mills, NP; Parrish, NP; Smoot, NP; Summer, MD; Vapne,  MD [address] [place], [place] [address] [phone] Mon-Fri 8:30-5:00, Sat 10:00-1:00 Providers come to see babies at [facility] Does NOT accept Medicaid Free prenatal information session Tuesdays at 4:45pm [facility] Bouska, MD; Gordon, PA; Jeffery, PA; Weber, PA [address] [place] [address] [phone] Mon-Fri 7:30-5:30 Babies seen by [facility] providers [facility] [address], [place], [place]  [address] [phone]   Fax - [phone]  [place] ([address] & [address]) [facility] [place] [address] [place], [place] [address] [phone] Mon-Thur 8:00-6:00 Providers come to see babies at [facility] Accepting Medicaid [facility] Anderson, NP; Badger, MD; Beal, PA; Spencer, PA [address] [place], [place] [address] [phone] Mon-Thur 7:30-7:30, Fri 7:30-4:30 Babies seen by [facility] providers Accepting Medicaid [facility] Agbuya, MD; Klett, NP; Romgoolam, MD [address], [place], [place] [address] [phone] Mon-Fri 8:30-5:00, Sat 8:30-12:00 Providers come to see babies at [facility] Accepting Medicaid Must have "Meet & Greet" appointment at office prior to delivery [facility] ([facility]) McCord, MD; Wallace, MD; Wood, MD [address], [place], [place] [address] [phone] Mon-Wed 8:00-6:00, Thur-Fri 8:00-5:00, Sat 9:00-12:00 Providers come to see babies at [facility] Does NOT accept Medicaid Only accepting siblings of current patients [facility]  [address], [place], [place]  [address] [phone]   Fax - [phone] [facility] [address], [place], [place]  [address] [phone]   Fax -  [phone]  [place] ([address] & [address])  [facility] [facility] Cirigliano, DO; Matthews, DO [address] [place],  [address] [phone] Mon-Fri 7:00-5:00 Babies seen by [facility] providers Does NOT accept Medicaid [facility] Briscoe, MD; Howley, PA; Moreira, PA [address], [place],  [address] [phone] Mon-Fri 8:00-5:00 Babies seen by [facility] providers Accepting Medicaid [facility] Boyd, MD; Church, PA; Jones, NP; Osborn, PA [address], [place],  [address] [phone] Mon-Fri 8:00-5:00 Babies seen by providers at [facility] Accepting Medicaid  [place] ([address]) [facility] [place] Wendling, DO [address] [place], [place] [address] [phone] Mon-Fri 8:00-5:00 Babies seen by [facility] providers Does NOT accept Medicaid Limited availability, please call early in hospitalization to schedule follow-up [facility] Calderon, PA; Cummings, MD; Dillard, MD; Martin, PA; Olson, MD; VanDeven, PA [address], [place], [place] [address] [phone] Mon-Fri 8:30-5:00, Sat 9:00-12:00 Babies seen by providers at [facility] Accepting Medicaid Please register online then schedule online or call office [email or web address]) Hunter, NP; Kumar, MD; Martin Rogers, PA [address], [place], [place] [address] [phone] Mon-Fri 8:00-5:00 Babies seen by providers at [facility] Accepting Medicaid [facility] ([facility]) [place], MD; Kristi Fleenor, NP; [place], MD [address], [place], [place] [address] [phone] Mon-Fri 8:00-5:30, Sat&Sun by appointment (phones open at  8:30) Babies seen by [facility] providers Accepting Medicaid Must be a first-time baby or sibling of current patient [facility] [place]  [address], [place], [place]  [address] [phone]   Fax - [phone]  [place] ([address]) [facility] Brown, PA; Cowen, PA; Rice, MD; Helton, PA; Spry, MD [address] [place], [place] [address] [phone] Mon-Thur 8:00-7:00, Fri 8:00-5:00, Sat 8:00-12:00, Sun 9:00-12:00 Babies seen by [facility] providers Accepting Medicaid [facility] [place] Coe-Goins, MD; Marshall, MD; Pierre-Louis, MD [address] [place] [address], [place], [place] [address] [phone] Mon-Thur 8:00-5:00 Babies seen by providers at [facility] Accepting Medicaid [facility] Bratton, MD; Coe-Goins, MD; Hayes, MD; Lewis, MD; List, MD; Lott, MD; Marshall, MD; Moran, MD; O'Neal, MD; Pierre-Louis, MD; Pitonzo, MD; Scholer, MD; Spangle, MD [address] [place], [place] [address] [phone] Mon-Fri 8:00-5:30, Sat (Oct.-Mar.) 9:00-1:00 Babies seen by providers at [facility] Accepting Medicaid Must fill out new patient packet, available online at [email or web address] [facility] ([facility]) Friddle, NP; Harris, NP; Kelly, NP; Logan, MD; Melvin, PA; Poth, MD; Ramadoss, MD; Stanton, NP [address], [place], [place] [address] [phone] Mon-Thur 8:00-5:30, Fri 8:00-5:00 Babies seen by providers at [facility] Accepting Medicaid  [place] ([address]) [facility] Dixon, PA; Aromas, MD; Pickard, MD; Tapia, PA [address], [place], [place] [address] [phone] Mon-Fri 8:00-5:00 Babies seen by providers at [facility] Accepting Medicaid   [place] ([address]) [facility] [place] Masneri, DO; Meyers, MD; Nelson, PA [address], [place], [place] [address] [phone] Mon-Fri 8:00-5:00 Babies seen by providers at [facility] Does NOT accept Medicaid Limited appointment availability, please call early in hospitalization  [facility] [place] Kunedd, DO; McGowen, MD [address], [place], [place] [address] [phone] Mon-Fri 8:00-5:00 Babies seen by [facility] providers Does NOT accept Medicaid [facility] [place] Cameron, MD; MacDonald, MD; Michaels, PA; Nayak, MD [address] [place] [address], [place], [place] [address] [phone] Mon-Fri 8:00-5:00 After hours clinic ([address] [place], [place] [address]) [phone] Mon-Fri 5:00-8:00, Sat 12:00-6:00, Sun 10:00-4:00 Babies seen by [facility] providers Accepting Medicaid [facility] [place] [address], [place], [place]  [address] [phone]   Fax - [phone]  [place] [phone]) Adult nurse [facility], MD [address] [place], [place], [place] [address] [phone] Mon-Fri 8:00-5:00 Babies seen by [facility] providers Does NOT accept Medicaid [facility] [place] [facility] [place]) Rene Kocher, MD [address], [place], [place] [address] [phone] Mon-Thur 8:00-7:00, Fri 8:00-5:00, Sat 8:00-12:00 Babies seen by providers at [facility] Accepting Medicaid - but does not have vaccinations in office (must be received elsewhere) Limited availability, please call early in hospitalization  [place] [phone]) [facility]  Wyvonne Lenz, MD [address], [place] [address] [phone]  Fax [phone]  [facility]  Lyndel Safe, MD, [place], [place], [place], [place] [address] [place], [place]  [address] [phone] [facility]  [address] Sherian Maroon [place], [place] [address] [phone] [address] [place], [place] [address] [phone] [facility] Office)  [facility] [address] [place], [place] [address] [phone] [facility] [address] [place], [place], [place] [address] [phone] [facility] [address], [place], [place] [address] [phone] [facility] [address] [place], [place] [address] [phone] [facility] [address], [place], [place] [address] [phone] [facility] [address], [place], [place] [address] [phone] [facility]  [address] [place], [place] [address] [phone] Dr. Belia Heman. [place] [address], [place], [place] [address] [phone] [facility] [address], [place], [place] [address] [phone] [facility] [address], [place], [place] [address] [phone]   Second Trimester of Pregnancy The second trimester of pregnancy is from week 13 through week 27. This is months 4 through 6 of pregnancy. The second trimester is often a time when you feel your best. Your body has adjusted to being pregnant, and you begin to feel better physically. During the second trimester: Morning sickness has lessened or stopped completely. You may have more energy. You may have an increase in appetite. The second trimester is also a time when the unborn baby (fetus) is growing rapidly. At the end of the sixth month, the fetus may be up to 12 inches long and weigh about 1 pounds. You will likely begin to feel the baby move (quickening) between 16 and 20 weeks of pregnancy. Body changes during your second trimester Your body continues to go through many changes during your second trimester. The changes vary and generally return to normal after the baby is born. Physical changes Your  weight will continue to increase. You will notice your lower abdomen bulging out. You may begin to get stretch marks on your hips, abdomen, and breasts. Your breasts will continue to grow and to become tender. Dark spots or blotches (chloasma or mask of pregnancy) may develop on your face. A dark line from your belly button to the pubic area (linea nigra) may appear. You may have changes in your hair. These can include thickening of your hair, rapid growth, and changes in texture. Some people also have hair loss during or after pregnancy, or hair that feels dry or thin. Health changes You may develop headaches. You may have heartburn. You may develop constipation. You may develop hemorrhoids or swollen, bulging veins (varicose veins). Your gums may bleed and may be sensitive to brushing and flossing. You may urinate more often because the fetus is pressing on your bladder. You  may have back pain. This is caused by: Weight gain. Pregnancy hormones that are relaxing the joints in your pelvis. A shift in weight and the muscles that support your balance. Follow these instructions at home: Medicines Follow your health care provider's instructions regarding medicine use. Specific medicines may be either safe or unsafe to take during pregnancy. Do not take any medicines unless approved by your health care provider. Take a prenatal vitamin that contains at least 600 micrograms (mcg) of folic acid. Eating and drinking Eat a healthy diet that includes fresh fruits and vegetables, whole grains, good sources of protein such as meat, eggs, or tofu, and low-fat dairy products. Avoid raw meat and unpasteurized juice, milk, and cheese. These carry germs that can harm you and your baby. You may need to take these actions to prevent or treat constipation: Drink enough fluid to keep your urine pale yellow. Eat foods that are high in fiber, such as beans, whole grains, and fresh fruits and vegetables. Limit  foods that are high in fat and processed sugars, such as fried or sweet foods. Activity Exercise only as directed by your health care provider. Most people can continue their usual exercise routine during pregnancy. Try to exercise for 30 minutes at least 5 days a week. Stop exercising if you develop contractions in your uterus. Stop exercising if you develop pain or cramping in the lower abdomen or lower back. Avoid exercising if it is very hot or humid or if you are at a high altitude. Avoid heavy lifting. If you choose to, you may have sex unless your health care provider tells you not to. Relieving pain and discomfort Wear a supportive bra to prevent discomfort from breast tenderness. Take warm sitz baths to soothe any pain or discomfort caused by hemorrhoids. Use hemorrhoid cream if your health care provider approves. Rest with your legs raised (elevated) if you have leg cramps or low back pain. If you develop varicose veins: Wear support hose as told by your health care provider. Elevate your feet for 15 minutes, 3-4 times a day. Limit salt in your diet. Safety Wear your seat belt at all times when driving or riding in a car. Talk with your health care provider if someone is verbally or physically abusive to you. Lifestyle Do not use hot tubs, steam rooms, or saunas. Do not douche. Do not use tampons or scented sanitary pads. Avoid cat litter boxes and soil used by cats. These carry germs that can cause birth defects in the baby and possibly loss of the fetus by miscarriage or stillbirth. Do not use herbal remedies, alcohol, illegal drugs, or medicines that are not approved by your health care provider. Chemicals in these products can harm your baby. Do not use any products that contain nicotine or tobacco, such as cigarettes, e-cigarettes, and chewing tobacco. If you need help quitting, ask your health care provider. General instructions During a routine prenatal visit, your health  care provider will do a physical exam and other tests. He or she will also discuss your overall health. Keep all follow-up visits. This is important. Ask your health care provider for a referral to a local prenatal education class. Ask for help if you have counseling or nutritional needs during pregnancy. Your health care provider can offer advice or refer you to specialists for help with various needs. Where to find more information American Pregnancy Association: americanpregnancy.org [employer]ians [employer] Gynecologists: https://www.todd-brady.net/ Office on [employer] Health: MightyReward.co.nz Contact a health care  provider if you have: A headache that does not go away when you take medicine. Vision changes or you see spots in front of your eyes. Mild pelvic cramps, pelvic pressure, or nagging pain in the abdominal area. Persistent nausea, vomiting, or diarrhea. A bad-smelling vaginal discharge or foul-smelling urine. Pain when you urinate. Sudden or extreme swelling of your face, hands, ankles, feet, or legs. A fever. Get help right away if you: Have fluid leaking from your vagina. Have spotting or bleeding from your vagina. Have severe abdominal cramping or pain. Have difficulty breathing. Have chest pain. Have fainting spells. Have not felt your baby move for the time period told by your health care provider. Have new or increased pain, swelling, or redness in an arm or leg. Summary The second trimester of pregnancy is from week 13 through week 27 (months 4 through 6). Do not use herbal remedies, alcohol, illegal drugs, or medicines that are not approved by your health care provider. Chemicals in these products can harm your baby. Exercise only as directed by your health care provider. Most people can continue their usual exercise routine during pregnancy. Keep all follow-up visits. This is important. This information is not intended to replace  advice given to you by your health care provider. Make sure you discuss any questions you have with your health care provider. Document Revised: 06/22/2019 Document Reviewed: 04/28/2019 Elsevier Patient Education  2022 [employer].

## 2021-04-02 NOTE — Progress Notes (Signed)
Subjective:  ?Michelle [name] is a 31 y.o. G6P0050 at [redacted]w[redacted]d being seen today for her first OB visit. EDD by first trimester U/S. Same sex relationship. Pregnancy via  sperm donor.    She is currently monitored for the following issues for this low-risk pregnancy and has Supervision of low-risk pregnancy; Mild intermittent asthma; and History of ELISA positive for HSV on their problem list. ? ?Patient reports no complaints.  Contractions: Not present. Vag. Bleeding: None.  Movement: Absent. Denies leaking of fluid.  ? ?The following portions of the patient's history were reviewed and updated as appropriate: allergies, current medications, past family history, past medical history, past social history, past surgical history and problem list. Problem list updated. ? ?Objective:  ? ?Vitals:  ? 04/02/21 0950  ?BP: 108/68  ?Pulse: 63  ?Weight: 135 lb 11.2 oz (61.6 kg)  ? ? ?Fetal Status: Fetal Heart Rate (bpm): 166   Movement: Absent    ? ?General:  Alert, oriented and cooperative. Patient is in no acute distress.  ?Skin: Skin is warm and dry. No rash noted.   ?Cardiovascular: Normal heart rate noted  ?Respiratory: Normal respiratory effort, no problems with respiration noted  ?Abdomen: Soft, gravid, appropriate for gestational age. Pain/Pressure: Absent     ?Pelvic:  Cervical exam performed        ?Extremities: Normal range of motion.  Edema: None  ?Mental Status: Normal mood and affect. Normal behavior. Normal judgment and thought content.  ? ?Urinalysis:     ? ?Assessment and Plan:  ?Pregnancy: [id number] at [redacted]w[redacted]d ? ?1. Encounter for supervision of low-risk pregnancy in second trimester ?Prenatal care and labs reviewed with pt ?Genetic testing discussed ?- Culture, OB Urine ?- Genetic Screening ?- CBC/D/Plt+RPR+Rh+ABO+RubIgG... ?- Hemoglobin A1c ?- Cytology - PAP( [facility]) ?- [place] MFM OB COMP + 14 WK; Future ? ?2. Mild intermittent asthma without complication ?To see PCP for conitnued management ?- albuterol (PROAIR  HFA) 108 (90 Base) MCG/ACT inhaler; Inhale 1-2 puffs into the lungs every 6 (six) hours as needed for wheezing or shortness of breath.  Dispense: 8 g; Refill: 1 ? ?3. History of ELISA positive for HSV ?Suppression at 36 weeks ? ?Preterm labor symptoms and general obstetric precautions including but not limited to vaginal bleeding, contractions, leaking of fluid and fetal movement were reviewed in detail with the patient. ?Please refer to After Visit Summary for other counseling recommendations.  ?Return in about 4 weeks (around 04/30/2021) for OB visit, face to face, any provider. ? ? ?Chancy Milroy, MD ?

## 2021-04-03 LAB — CBC/D/PLT+RPR+RH+ABO+RUBIGG...
Antibody Screen: NEGATIVE
Basophils Absolute: 0.1 10*3/uL (ref 0.0–0.2)
Basos: 1 %
EOS (ABSOLUTE): 0.6 10*3/uL — ABNORMAL HIGH (ref 0.0–0.4)
Eos: 5 %
HCV Ab: NONREACTIVE
HIV Screen 4th Generation wRfx: NONREACTIVE
Hematocrit: 38.2 % (ref 34.0–46.6)
Hemoglobin: 12.6 g/dL (ref 11.1–15.9)
Hepatitis B Surface Ag: NEGATIVE
Immature Grans (Abs): 0 10*3/uL (ref 0.0–0.1)
Immature Granulocytes: 0 %
Lymphocytes Absolute: 2.2 10*3/uL (ref 0.7–3.1)
Lymphs: 18 %
MCH: 29.4 pg (ref 26.6–33.0)
MCHC: 33 g/dL (ref 31.5–35.7)
MCV: 89 fL (ref 79–97)
Monocytes Absolute: 0.9 10*3/uL (ref 0.1–0.9)
Monocytes: 7 %
Neutrophils Absolute: 8.6 10*3/uL — ABNORMAL HIGH (ref 1.4–7.0)
Neutrophils: 69 %
Platelets: 292 10*3/uL (ref 150–450)
RBC: 4.28 x10E6/uL (ref 3.77–5.28)
RDW: 12.9 % (ref 11.7–15.4)
RPR Ser Ql: NONREACTIVE
Rh Factor: POSITIVE
Rubella Antibodies, IGG: 5.81 index (ref >0.99)
WBC: 12.4 10*3/uL — ABNORMAL HIGH (ref 3.4–10.8)

## 2021-04-03 LAB — CYTOLOGY - PAP
Chlamydia: NEGATIVE
Comment: NEGATIVE
Comment: NORMAL
Diagnosis: NEGATIVE
Neisseria Gonorrhea: NEGATIVE

## 2021-04-03 LAB — HCV INTERPRETATION

## 2021-04-03 LAB — HEMOGLOBIN A1C
Est. average glucose Bld gHb Est-mCnc: 108 mg/dL
Hgb A1c MFr Bld: 5.4 % (ref 4.8–5.6)

## 2021-04-04 ENCOUNTER — Encounter: Payer: Self-pay | Admitting: Obstetrics and Gynecology

## 2021-04-04 ENCOUNTER — Other Ambulatory Visit: Payer: Self-pay | Admitting: Lactation Services

## 2021-04-04 ENCOUNTER — Telehealth: Payer: Self-pay

## 2021-04-04 DIAGNOSIS — A5901 Trichomonal vulvovaginitis: Secondary | ICD-10-CM | POA: Insufficient documentation

## 2021-04-04 LAB — URINE CULTURE, OB REFLEX

## 2021-04-04 LAB — CULTURE, OB URINE

## 2021-04-04 MED ORDER — METRONIDAZOLE 500 MG PO TABS: 500.0000 mg | ORAL_TABLET | Freq: Two times a day (BID) | ORAL | 0 refills | Status: DC

## 2021-04-04 NOTE — Progress Notes (Signed)
Flagyl ordered per standing order for + Trich on Pap Smear.  ?

## 2021-04-04 NOTE — Telephone Encounter (Addendum)
-----   Message from Hermina Staggers, MD sent at 04/04/2021 11:15 AM EST ----- ?Please send in Rx for Trich as per protocol. ?Pt is aware. ? ?Thanks ?Casimiro Needle  ? ? ?Patient treated. See patient message thread from 04/04/21. ?

## 2021-04-12 ENCOUNTER — Encounter: Payer: Self-pay | Admitting: *Deleted

## 2021-04-30 ENCOUNTER — Encounter: Payer: BLUE CROSS/BLUE SHIELD | Admitting: Obstetrics and Gynecology

## 2021-05-08 ENCOUNTER — Telehealth (INDEPENDENT_AMBULATORY_CARE_PROVIDER_SITE_OTHER): Payer: BLUE CROSS/BLUE SHIELD | Admitting: Family Medicine

## 2021-05-08 ENCOUNTER — Encounter: Payer: Self-pay | Admitting: Family Medicine

## 2021-05-08 DIAGNOSIS — Z3A18 18 weeks gestation of pregnancy: Secondary | ICD-10-CM

## 2021-05-08 DIAGNOSIS — O99512 Diseases of the respiratory system complicating pregnancy, second trimester: Secondary | ICD-10-CM

## 2021-05-08 DIAGNOSIS — J4520 Mild intermittent asthma, uncomplicated: Secondary | ICD-10-CM

## 2021-05-08 DIAGNOSIS — Z3492 Encounter for supervision of normal pregnancy, unspecified, second trimester: Secondary | ICD-10-CM

## 2021-05-08 DIAGNOSIS — A5901 Trichomonal vulvovaginitis: Secondary | ICD-10-CM

## 2021-05-08 DIAGNOSIS — O23592 Infection of other part of genital tract in pregnancy, second trimester: Secondary | ICD-10-CM

## 2021-05-08 MED ORDER — FLUTICASONE PROPIONATE HFA 110 MCG/ACT IN AERO: 1.0000 | INHALATION_SPRAY | Freq: Every day | RESPIRATORY_TRACT | 12 refills | Status: DC

## 2021-05-08 MED ORDER — FLUTICASONE PROPIONATE 50 MCG/ACT NA SUSP: 1.0000 | Freq: Every day | NASAL | 2 refills | Status: DC

## 2021-05-08 MED ORDER — LORATADINE 10 MG PO TABS: 10.0000 mg | ORAL_TABLET | Freq: Every day | ORAL | 5 refills | Status: DC

## 2021-05-08 NOTE — Progress Notes (Signed)
I connected with Michelle [name] 05/08/21 at  4:15 PM EDT by: MyChart video and verified that I am speaking with the correct person using two identifiers.  Patient is located at home and provider is located at [facility].   ?  ?The purpose of this virtual visit is to provide medical care while limiting exposure to the novel coronavirus. I discussed the limitations, risks, security and privacy concerns of performing an evaluation and management service by MyChart video and the availability of in person appointments. I also discussed with the patient that there may be a patient responsible charge related to this service. By engaging in this virtual visit, you consent to the provision of healthcare.  Additionally, you authorize for your insurance to be billed for the services provided during this visit.  The patient expressed understanding and agreed to proceed. ? ?The following staff members participated in the virtual visit:  Venora Maples, MD/MPH ?Attending Family Medicine Physician, Faculty Practice ?Center for [employer], [facility] ? ? ? ? ?PRENATAL VISIT NOTE ? ?Subjective:  ?Michelle [name] is a 32 y.o. G6P0050 at [redacted]w[redacted]d  for phone visit for ongoing prenatal care.  She is currently monitored for the following issues for this low-risk pregnancy and has Supervision of low-risk pregnancy; Mild intermittent asthma; History of ELISA positive for HSV; and Trichomonal vaginitis during pregnancy in first trimester on their problem list. ? ?Patient reports no complaints.  Contractions: Not present. Vag. Bleeding: None.  Movement: Present. Denies leaking of fluid.  ? ?The following portions of the patient's history were reviewed and updated as appropriate: allergies, current medications, past family history, past medical history, past social history, past surgical history and problem list.  ? ?Objective:  ?There were no vitals filed for this visit. Self-Obtained ? ?Fetal Status:      Movement: Present    ? ?Assessment and Plan:  ?Pregnancy: G6P0050 at [redacted]w[redacted]d ?1. Encounter for supervision of low-risk pregnancy in second trimester ?Fetal movement present, not able to take BP ?Due for AFP, coming for MFM visit on 05/14/21, will send message to get her scheduled for a lab visit that same day ? ?2. Trichomonal vaginitis during pregnancy in first trimester ?Took full course ?TOC at next in person visit ? ?3. Mild intermittent asthma ?Reporting daily inhaler use of albuterol ?Also having lots of allergy symptoms ?Recommended she start controller inhaler, rx sent for flovent and advised will not work unless she takes it daily and consistently ?Also start flonase and claritin for allergy symptoms ? ?Preterm labor symptoms and general obstetric precautions including but not limited to vaginal bleeding, contractions, leaking of fluid and fetal movement were reviewed in detail with the patient. ? ?Return in about 4 weeks (around 06/05/2021) for [facility]. ? ?Future Appointments  ?Date Time Provider Department Center  ?05/14/2021  8:30 AM [facility] US3 [facility]  ?06/05/2021 10:15 AM Venora Maples, MD [facility]  ? ? ? ?Time spent on virtual visit: 10 minutes ? ?Venora Maples, MD ? ?

## 2021-05-14 ENCOUNTER — Ambulatory Visit: Payer: BLUE CROSS/BLUE SHIELD | Attending: Obstetrics and Gynecology

## 2021-05-14 ENCOUNTER — Other Ambulatory Visit: Payer: BLUE CROSS/BLUE SHIELD

## 2021-05-14 ENCOUNTER — Other Ambulatory Visit: Payer: Self-pay | Admitting: *Deleted

## 2021-05-14 DIAGNOSIS — Z3A19 19 weeks gestation of pregnancy: Secondary | ICD-10-CM | POA: Insufficient documentation

## 2021-05-14 DIAGNOSIS — O09819 Supervision of pregnancy resulting from assisted reproductive technology, unspecified trimester: Secondary | ICD-10-CM | POA: Diagnosis present

## 2021-05-14 DIAGNOSIS — O09812 Supervision of pregnancy resulting from assisted reproductive technology, second trimester: Secondary | ICD-10-CM | POA: Insufficient documentation

## 2021-05-14 DIAGNOSIS — Z362 Encounter for other antenatal screening follow-up: Secondary | ICD-10-CM

## 2021-05-14 DIAGNOSIS — Z3492 Encounter for supervision of normal pregnancy, unspecified, second trimester: Secondary | ICD-10-CM

## 2021-05-15 ENCOUNTER — Encounter: Payer: Self-pay | Admitting: Family Medicine

## 2021-05-16 LAB — AFP, SERUM, OPEN SPINA BIFIDA
AFP MoM: 0.88
AFP Value: 54 ng/mL
Gest. Age on Collection Date: 19.1 weeks
Maternal Age At EDD: 29 yr
OSBR Risk 1 IN: 10000
Test Results:: NEGATIVE
Weight: 135 [lb_av]

## 2021-05-29 ENCOUNTER — Ambulatory Visit (HOSPITAL_COMMUNITY)
Admission: EM | Admit: 2021-05-29 | Discharge: 2021-05-29 | Disposition: A | Discharge status: Home / Self Care | Payer: Medicaid Other | Attending: Emergency Medicine | Admitting: Emergency Medicine

## 2021-05-29 ENCOUNTER — Encounter (HOSPITAL_COMMUNITY): Payer: Self-pay

## 2021-05-29 ENCOUNTER — Encounter: Payer: Self-pay | Admitting: Family

## 2021-05-29 DIAGNOSIS — K122 Cellulitis and abscess of mouth: Secondary | ICD-10-CM | POA: Diagnosis not present

## 2021-05-29 MED ORDER — CLINDAMYCIN HCL 300 MG PO CAPS: 300.0000 mg | ORAL_CAPSULE | Freq: Three times a day (TID) | ORAL | 0 refills | Status: DC

## 2021-05-29 MED ORDER — LIDOCAINE VISCOUS HCL 2 % MT SOLN: 15.0000 mL | OROMUCOSAL | 0 refills | Status: DC | PRN

## 2021-05-29 NOTE — Discharge Instructions (Signed)
An abscess was noted in your left cheek most likely related to your tooth ? ?On the second page of your packet there is a list of over-the-counter medications that are safe for you to use while pregnant ? ?Take clindamycin 3 times daily for the next 7 days to help clear infection ? ?You may use Tylenol every 6 hours as needed for management of discomfort ? ?You may use lidocaine solution, gargle and spit every 4 hours as needed to give a temporary numbing effect to your mouth ? ?You may gargle salt water, use warm liquids, throat lozenges, teaspoons of honey and eat soft foods for additional support ? ?You may follow-up with urgent care as needed ?

## 2021-05-29 NOTE — ED Provider Notes (Signed)
?[facility] ? ? ? ?CSN: [id number] ?Arrival date & time: 05/29/21  1230 ? ? ?  ? ?History   ?Chief Complaint ?Chief Complaint  ?Patient presents with  ? lymph node  ? Oral Swelling  ? ? ?HPI ?Michelle [name] is a 31 y.o. female.  ? ?Presents with left sided gingival and cheek swelling, swollen lymph nodes to the left side of the neck, and a sore throat for 2 days.  Painful to chew, interfering with eating.  No known sick contacts.  Has not attempted treatment of symptoms.  Endorses need for dental work and cracked tooth at the site of concern.  Currently [redacted] weeks pregnant.   ? ?Past Medical History:  ?Diagnosis Date  ? Asthma   ? Herpes simplex conjunctivitis of both eyes   ? ? ?Patient Active Problem List  ? Diagnosis Date Noted  ? Trichomonal vaginitis during pregnancy in first trimester 04/04/2021  ? History of ELISA positive for HSV 04/02/2021  ? Mild intermittent asthma 03/27/2021  ? Supervision of low-risk pregnancy 03/26/2021  ? ? ?Past Surgical History:  ?Procedure Laterality Date  ? FRACTURE SURGERY Right   ? leg  ? ? ?OB History   ? ? Gravida  ?6  ? Para  ?   ? Term  ?   ? Preterm  ?   ? AB  ?5  ? Living  ?   ?  ? ? SAB  ?   ? IAB  ?   ? Ectopic  ?   ? Multiple  ?   ? Live Births  ?   ?   ?  ?  ? ? ? ?Home Medications   ? ?Prior to Admission medications   ?Medication Sig Start Date End Date Taking? Authorizing Provider  ?albuterol (PROAIR HFA) 108 (90 Base) MCG/ACT inhaler Inhale 1-2 puffs into the lungs every 6 (six) hours as needed for wheezing or shortness of breath. 04/02/21   Hermina Staggers, MD  ?fluticasone (FLONASE) 50 MCG/ACT nasal spray Place 1 spray into both nostrils daily. 05/08/21   Venora Maples, MD  ?fluticasone (FLOVENT HFA) 110 MCG/ACT inhaler Inhale 1 puff into the lungs daily. 05/08/21   Venora Maples, MD  ?loratadine (CLARITIN) 10 MG tablet Take 1 tablet (10 mg total) by mouth daily. 05/08/21   Venora Maples, MD  ?Prenatal Vit-Fe Fumarate-FA (PRENATAL VITAMIN PO)  Take 1 tablet by mouth daily.    [provider]  ? ? ?Family History ?Family History  ?Adopted: Yes  ?Problem Relation Age of Onset  ? Healthy Mother   ? Healthy Father   ? ? ?Social History ?Social History  ? ?Tobacco Use  ? Smoking status: Never  ? Smokeless tobacco: Never  ?Vaping Use  ? Vaping Use: Never used  ?Substance Use Topics  ? Alcohol use: Yes  ?  Comment: daily until stopped when found our ptregnant  ? Drug use: Yes  ?  Types: Marijuana  ?  Comment: 03/26/21 uses seldom  ? ? ? ?Allergies   ?Patient has no known allergies. ? ? ?Review of Systems ?Review of Systems  ?Constitutional: Negative.   ?HENT:  Positive for dental problem, facial swelling and sore throat. Negative for congestion, drooling, ear discharge, ear pain, hearing loss, mouth sores, nosebleeds, postnasal drip, rhinorrhea, sinus pressure, sinus pain, sneezing, tinnitus, trouble swallowing and voice change.   ?Respiratory: Negative.    ?Cardiovascular: Negative.   ? ? ?Physical Exam ?Triage Vital Signs ?ED Triage  Vitals  ?Enc Vitals Group  ?   BP 05/29/21 1304 109/70  ?   Pulse Rate 05/29/21 1304 81  ?   Resp 05/29/21 1304 17  ?   Temp 05/29/21 1304 98.6 ?F (37 ?C)  ?   Temp Source 05/29/21 1304 Oral  ?   SpO2 05/29/21 1304 98 %  ?   Weight --   ?   Height --   ?   Head Circumference --   ?   Peak Flow --   ?   Pain Score 05/29/21 1303 5  ?   Pain Loc --   ?   Pain Edu? --   ?   Excl. in GC? --   ? ?No data found. ? ?Updated Vital Signs ?BP 109/70 (BP Location: Right Arm)   Pulse 81   Temp 98.6 ?F (37 ?C) (Oral)   Resp 17   LMP  (LMP Unknown)   SpO2 98%  ? ?Visual Acuity ?Right Eye Distance:   ?Left Eye Distance:   ?Bilateral Distance:   ? ?Right Eye Near:   ?Left Eye Near:    ?Bilateral Near:    ? ?Physical Exam ?Constitutional:   ?   Appearance: Normal appearance.  ?HENT:  ?   Head: Normocephalic.  ?   Mouth/Throat:  ?   Comments: 1 x 2 abscess noted to the left internal cheek, mild to moderate gingival swelling, pharynx  clear, no erythema, exudate or tonsillar adenopathy noted ?Eyes:  ?   Extraocular Movements: Extraocular movements intact.  ?Neurological:  ?   Mental Status: She is alert and oriented to person, place, and time. Mental status is at baseline.  ?Psychiatric:     ?   Mood and Affect: Mood normal.     ?   Behavior: Behavior normal.  ? ? ? ?UC Treatments / Results  ?Labs ?(all labs ordered are listed, but only abnormal results are displayed) ?Labs Reviewed - No data to display ? ?EKG ? ? ?Radiology ?No results found. ? ?Procedures ?Procedures (including critical care time) ? ?Medications Ordered in UC ?Medications - No data to display ? ?Initial Impression / Assessment and Plan / UC Course  ?I have reviewed the triage vital signs and the nursing notes. ? ?Pertinent labs & imaging results that were available during my care of the patient were reviewed by me and considered in my medical decision making (see chart for details). ? ?Abscess of left internal cheek ? ?Clindamycin 7-day course prescribed as well as viscous lidocaine for management of symptoms, may use over-the-counter Tylenol for additional comfort as well as salt water gargles, throat lozenges warm liquids soft foods and teaspoons of honey, may follow-up with urgent care as needed for nonhealing nondraining site, given written handout of over-the-counter safe medications for pregnancy ?Final Clinical Impressions(s) / UC Diagnoses  ? ?Final diagnoses:  ?None  ? ?Discharge Instructions   ?None ?  ? ?ED Prescriptions   ?None ?  ? ?PDMP not reviewed this encounter. ?  ?Valinda Hoar, NP ?05/29/21 1404 ? ?

## 2021-05-29 NOTE — Telephone Encounter (Signed)
she needs to see a dentist or have an office visit

## 2021-05-29 NOTE — ED Triage Notes (Signed)
Pt states oral swelling since yesterday. Pt states her gums and tongue is swollen. Pt states lymph node under left side of her neck.Pt has not taken medication since she is [redacted] weeks pregnant. ?

## 2021-06-05 ENCOUNTER — Encounter: Payer: Self-pay | Admitting: Family Medicine

## 2021-06-05 ENCOUNTER — Telehealth (INDEPENDENT_AMBULATORY_CARE_PROVIDER_SITE_OTHER): Payer: BLUE CROSS/BLUE SHIELD | Admitting: Family Medicine

## 2021-06-05 DIAGNOSIS — Z3A22 22 weeks gestation of pregnancy: Secondary | ICD-10-CM

## 2021-06-05 DIAGNOSIS — A5901 Trichomonal vulvovaginitis: Secondary | ICD-10-CM

## 2021-06-05 DIAGNOSIS — O23592 Infection of other part of genital tract in pregnancy, second trimester: Secondary | ICD-10-CM

## 2021-06-05 DIAGNOSIS — Z3492 Encounter for supervision of normal pregnancy, unspecified, second trimester: Secondary | ICD-10-CM

## 2021-06-05 DIAGNOSIS — O99512 Diseases of the respiratory system complicating pregnancy, second trimester: Secondary | ICD-10-CM

## 2021-06-05 DIAGNOSIS — J4520 Mild intermittent asthma, uncomplicated: Secondary | ICD-10-CM

## 2021-06-05 MED ORDER — FLUCONAZOLE 150 MG PO TABS: 150.0000 mg | ORAL_TABLET | Freq: Once | ORAL | 0 refills | Status: AC

## 2021-06-05 NOTE — Progress Notes (Signed)
Patient for routine prenatal visit. Connected virtually, identified by name and DOB.  ? ?Patient reports good fetal movement, patient has been experiencing itching and burning. States she was taking clindamycin but stopped because she feels like it was giving her a yeast infection. Denies discharge, only burning and itching. ? ?Patient unable to check BP or weight at this time. ? ?No other issues or concerns. ? ? ?Wynona Canes, CMA ? ?

## 2021-06-05 NOTE — Progress Notes (Signed)
I connected with Michelle [name] 06/05/21 at 10:15 AM EDT by: MyChart video and verified that I am speaking with the correct person using two identifiers.  Patient is located at home and provider is located at [facility].   ?  ?I discussed the limitations, risks, security and privacy concerns of performing an evaluation and management service by MyChart video and the availability of in person appointments. I also discussed with the patient that there may be a patient responsible charge related to this service. By engaging in this virtual visit, you consent to the provision of healthcare.  Additionally, you authorize for your insurance to be billed for the services provided during this visit.  The patient expressed understanding and agreed to proceed. ? ?The following staff members participated in the virtual visit:  Venora Maples, MD/MPH ?Attending Family Medicine Physician, Faculty Practice ?Center for [employer], [facility] ? ? ? ? ? ?PRENATAL VISIT NOTE ? ?Subjective:  ?Michelle [name] is a 31 y.o. G6P0050 at [redacted]w[redacted]d  for virtual visit for ongoing prenatal care.  She is currently monitored for the following issues for this low-risk pregnancy and has Supervision of low-risk pregnancy; Mild intermittent asthma; History of ELISA positive for HSV; and Trichomonal vaginitis during pregnancy in first trimester on their problem list. ? ?Patient reports  possible yeast infection after taking clindaycin for tooth infection, has not yet seen dentist .  Contractions: Not present. Vag. Bleeding: None.  Movement: Present. Denies leaking of fluid.  ? ?The following portions of the patient's history were reviewed and updated as appropriate: allergies, current medications, past family history, past medical history, past social history, past surgical history and problem list.  ? ?Objective:  ?There were no vitals filed for this visit. Self-Obtained ? ?Fetal Status:     Movement: Present     ? ?Assessment and Plan:  ?Pregnancy: G6P0050 at [redacted]w[redacted]d ?1. Encounter for supervision of low-risk pregnancy in second trimester ?Normal fetal movement ?Given clinda for a dental infection but hasn't seen dentist yet as she thought she couldn't ?Also thinks it gave her a yeast infection ?1x diflucan for likely yeast infection ?Discussed dental work is fine in pregnancy, happy to provide a letter if her dentist needs one, short course of pain meds also very appropriate ? ?2. Mild intermittent asthma without complication ?Improved ?Taking flovent daily ?Using albuterol only at night, maybe twice a week ? ?3. Trichomonal vaginitis during pregnancy in first trimester ?S/p treatment, no TOC on file yet, will get at next visit ? ?Preterm labor symptoms and general obstetric precautions including but not limited to vaginal bleeding, contractions, leaking of fluid and fetal movement were reviewed in detail with the patient. ? ?Return in about 4 weeks (around 07/03/2021) for [facility] patient, ob visit, in person. ? ?Future Appointments  ?Date Time Provider Department Center  ?06/11/2021  8:15 AM [facility] NURSE [facility]  ?06/11/2021  8:30 AM [facility] US3 [facility]  ?07/04/2021  1:35 PM Federico Flake, MD [facility]  ?07/18/2021  8:15 AM Federico Flake, MD [facility]  ?07/18/2021  8:50 AM [facility]  ? ? ? ?Time spent on virtual visit: 12 minutes ? ?Venora Maples, MD ? ?

## 2021-06-11 ENCOUNTER — Encounter: Payer: Self-pay | Admitting: *Deleted

## 2021-06-11 ENCOUNTER — Ambulatory Visit: Payer: BLUE CROSS/BLUE SHIELD | Admitting: *Deleted

## 2021-06-11 ENCOUNTER — Other Ambulatory Visit: Payer: Self-pay | Admitting: *Deleted

## 2021-06-11 ENCOUNTER — Ambulatory Visit: Payer: BLUE CROSS/BLUE SHIELD | Attending: Obstetrics

## 2021-06-11 VITALS — BP 111/64 | HR 75

## 2021-06-11 DIAGNOSIS — O09812 Supervision of pregnancy resulting from assisted reproductive technology, second trimester: Secondary | ICD-10-CM | POA: Diagnosis not present

## 2021-06-11 DIAGNOSIS — Z3A23 23 weeks gestation of pregnancy: Secondary | ICD-10-CM

## 2021-06-11 DIAGNOSIS — O09813 Supervision of pregnancy resulting from assisted reproductive technology, third trimester: Secondary | ICD-10-CM

## 2021-06-11 DIAGNOSIS — Z3492 Encounter for supervision of normal pregnancy, unspecified, second trimester: Secondary | ICD-10-CM

## 2021-06-11 DIAGNOSIS — Z362 Encounter for other antenatal screening follow-up: Secondary | ICD-10-CM | POA: Insufficient documentation

## 2021-06-11 DIAGNOSIS — O321XX0 Maternal care for breech presentation, not applicable or unspecified: Secondary | ICD-10-CM | POA: Insufficient documentation

## 2021-07-03 ENCOUNTER — Ambulatory Visit (HOSPITAL_COMMUNITY)
Admission: EM | Admit: 2021-07-03 | Discharge: 2021-07-03 | Disposition: A | Discharge status: Home / Self Care | Payer: Medicaid Other

## 2021-07-03 ENCOUNTER — Other Ambulatory Visit: Payer: Self-pay

## 2021-07-03 ENCOUNTER — Inpatient Hospital Stay (HOSPITAL_COMMUNITY)
Admission: AD | Admit: 2021-07-03 | Discharge: 2021-07-03 | Disposition: A | Discharge status: Home / Self Care | Payer: Medicaid Other | Attending: Obstetrics & Gynecology | Admitting: Obstetrics & Gynecology

## 2021-07-03 ENCOUNTER — Encounter (HOSPITAL_COMMUNITY): Payer: Self-pay | Admitting: Obstetrics & Gynecology

## 2021-07-03 DIAGNOSIS — O23592 Infection of other part of genital tract in pregnancy, second trimester: Secondary | ICD-10-CM | POA: Diagnosis not present

## 2021-07-03 DIAGNOSIS — B9689 Other specified bacterial agents as the cause of diseases classified elsewhere: Secondary | ICD-10-CM

## 2021-07-03 DIAGNOSIS — N760 Acute vaginitis: Secondary | ICD-10-CM

## 2021-07-03 DIAGNOSIS — Z3A26 26 weeks gestation of pregnancy: Secondary | ICD-10-CM | POA: Diagnosis not present

## 2021-07-03 DIAGNOSIS — O26899 Other specified pregnancy related conditions, unspecified trimester: Secondary | ICD-10-CM

## 2021-07-03 DIAGNOSIS — O26892 Other specified pregnancy related conditions, second trimester: Secondary | ICD-10-CM | POA: Diagnosis not present

## 2021-07-03 LAB — WET PREP, GENITAL
Sperm: NONE SEEN
Trich, Wet Prep: NONE SEEN
WBC, Wet Prep HPF POC: >=10 — AB (ref <10)
Yeast Wet Prep HPF POC: NONE SEEN

## 2021-07-03 MED ORDER — FLUCONAZOLE 150 MG PO TABS: 150.0000 mg | ORAL_TABLET | Freq: Every day | ORAL | 0 refills | Status: DC

## 2021-07-03 MED ORDER — METRONIDAZOLE 0.75 % VA GEL: 1.0000 | Freq: Two times a day (BID) | VAGINAL | 0 refills | Status: DC

## 2021-07-03 NOTE — MAU Note (Signed)
.  Michelle [name] is a 31 y.o. at [redacted]w[redacted]d here in MAU reporting: she went to restroom at 1800 and when she wiped there was blood on the tissue. Denies pain. Reports positive fetal movement.   Onset of complaint: 1800 Pain score: 0/10 Vitals:   07/03/21 1846  BP: 114/63  Pulse: 78  Resp: 15  Temp: 98.5 F (36.9 C)  SpO2: 100%     FHT:[id number] Lab orders placed from triage:

## 2021-07-03 NOTE — MAU Provider Note (Signed)
History     CSN: [id number]  Arrival date and time: 07/03/21 1825   Event Date/Time   First Provider Initiated Contact with Patient 07/03/21 1903      No chief complaint on file.  HPI  [name] is a 31 y.o. Female [id number] @ [redacted]w[redacted]d here with spotting. She noticed red/pink spotting today while wiping. She noticed it 3 times and each time it got lighter. She did not see the blood in the toilet or on her underwear. She has no pain. No recent intercourse or anything in the vagina in the last 24 hours.   OB History     Gravida  6   Para      Term      Preterm      AB  5   Living         SAB      IAB      Ectopic      Multiple      Live Births              Past Medical History:  Diagnosis Date   Asthma    Herpes simplex conjunctivitis of both eyes     Past Surgical History:  Procedure Laterality Date   FRACTURE SURGERY Right    leg    Family History  Adopted: Yes  Problem Relation Age of Onset   Healthy Mother    Healthy Father     Social History   Tobacco Use   Smoking status: Never   Smokeless tobacco: Never  Vaping Use   Vaping Use: Never used  Substance Use Topics   Alcohol use: Yes    Comment: daily until stopped when found our ptregnant   Drug use: Yes    Types: Marijuana    Comment: 03/26/21 uses seldom    Allergies: No Known Allergies  Medications Prior to Admission  Medication Sig Dispense Refill Last Dose   albuterol (PROAIR HFA) 108 (90 Base) MCG/ACT inhaler Inhale 1-2 puffs into the lungs every 6 (six) hours as needed for wheezing or shortness of breath. 8 g 1 07/02/2021   loratadine (CLARITIN) 10 MG tablet Take 1 tablet (10 mg total) by mouth daily. 30 tablet 5 Past Week   Prenatal Vit-Fe Fumarate-FA (PRENATAL VITAMIN PO) Take 1 tablet by mouth daily.   07/03/2021   clindamycin (CLEOCIN) 300 MG capsule Take 1 capsule (300 mg total) by mouth 3 (three) times daily. (Patient not taking: Reported on 06/05/2021) 21 capsule 0     fluticasone (FLONASE) 50 MCG/ACT nasal spray Place 1 spray into both nostrils daily. 18.2 mL 2    fluticasone (FLOVENT HFA) 110 MCG/ACT inhaler Inhale 1 puff into the lungs daily. 1 each 12    lidocaine (XYLOCAINE) 2 % solution Use as directed 15 mLs in the mouth or throat as needed for mouth pain. 100 mL 0    Results for orders placed or performed during the hospital encounter of 07/03/21 (from the past 48 hour(s))  Wet prep, genital     Status: Abnormal   Collection Time: 07/03/21  7:10 PM   Specimen: Vaginal  Result Value Ref Range   Yeast Wet Prep HPF POC NONE SEEN NONE SEEN   Trich, Wet Prep NONE SEEN NONE SEEN   Clue Cells Wet Prep HPF POC PRESENT (A) NONE SEEN   WBC, Wet Prep HPF POC >=10 (A) <10   Sperm NONE SEEN     Comment: Performed at [facility],  [address] [place], [place] [address]     Review of Systems  Constitutional:  Negative for fever.  Gastrointestinal:  Negative for abdominal pain.  Genitourinary:  Positive for vaginal bleeding and vaginal discharge.  Physical Exam   Blood pressure 114/63, pulse 78, temperature 98.5 F (36.9 C), resp. rate 15, SpO2 100 %.  Physical Exam Constitutional:      General: She is not in acute distress.    Appearance: Normal appearance. She is not ill-appearing, toxic-appearing or diaphoretic.  Genitourinary:    Comments: Vagina - Small amount of thick, patchy, white vaginal discharge, no odor  Cervix - No contact bleeding, no active bleeding  Bimanual exam: deferred  GC/Chlam, wet prep done Chaperone present for exam.   Musculoskeletal:        General: Normal range of motion.  Skin:    General: Skin is warm.  Neurological:     Mental Status: She is alert and oriented to person, place, and time.  Psychiatric:        Behavior: Behavior normal.   Fetal Tracing: Baseline: 150 bpm Variability: Moderate  Accelerations: 10x10 Decelerations: None Toco: None  MAU Course  Procedures None  MDM  O positive  blood type  No blood noted on exam. O positive blood type.   Assessment and Plan    A:  1. Bacterial vaginosis   2. [redacted] weeks gestation of pregnancy   3. Vaginal discharge during pregnancy, antepartum      P:  Dc home  Rx: metrogel per patient request One dose of diflucan ordered for post flagyl.  Exam showed ? Yeast Return to MAU if symptoms worsen  Pelvic rest   Jalene Lacko, Artist Pais, NP 07/04/2021 9:32 AM

## 2021-07-03 NOTE — ED Notes (Signed)
Patient is being discharged from the Urgent Care and sent to the Emergency Department via POV . Per Denny Peon PA, patient is in need of higher level of care due to [redacted]wks pregnant and bleeding. Patient is aware and verbalizes understanding of plan of care. There were no vitals filed for this visit.

## 2021-07-04 ENCOUNTER — Ambulatory Visit (INDEPENDENT_AMBULATORY_CARE_PROVIDER_SITE_OTHER): Payer: BLUE CROSS/BLUE SHIELD | Admitting: Family Medicine

## 2021-07-04 VITALS — BP 116/70 | HR 76 | Wt 158.7 lb

## 2021-07-04 DIAGNOSIS — Z3492 Encounter for supervision of normal pregnancy, unspecified, second trimester: Secondary | ICD-10-CM | POA: Diagnosis not present

## 2021-07-04 DIAGNOSIS — A5901 Trichomonal vulvovaginitis: Secondary | ICD-10-CM

## 2021-07-04 DIAGNOSIS — J4520 Mild intermittent asthma, uncomplicated: Secondary | ICD-10-CM

## 2021-07-04 DIAGNOSIS — Z23 Encounter for immunization: Secondary | ICD-10-CM

## 2021-07-04 DIAGNOSIS — O23591 Infection of other part of genital tract in pregnancy, first trimester: Secondary | ICD-10-CM

## 2021-07-04 DIAGNOSIS — Z8619 Personal history of other infectious and parasitic diseases: Secondary | ICD-10-CM

## 2021-07-04 LAB — GC/CHLAMYDIA PROBE AMP (~~LOC~~) NOT AT ARMC
Chlamydia: NEGATIVE
Comment: NEGATIVE
Comment: NORMAL
Neisseria Gonorrhea: NEGATIVE

## 2021-07-04 NOTE — Progress Notes (Signed)
   PRENATAL VISIT NOTE  Subjective:  Michelle [name] is a 31 y.o. G6P0050 at [redacted]w[redacted]d being seen today for ongoing prenatal care.  She is currently monitored for the following issues for this low-risk pregnancy and has Supervision of low-risk pregnancy; Mild intermittent asthma; History of ELISA positive for HSV; and Trichomonal vaginitis during pregnancy in first trimester on their problem list.  Patient reports no complaints.  Contractions: Not present. Vag. Bleeding: Bloody Show.  Movement: Present. Denies leaking of fluid.   The following portions of the patient's history were reviewed and updated as appropriate: allergies, current medications, past family history, past medical history, past social history, past surgical history and problem list.   Objective:   Vitals:   07/04/21 1349  BP: 116/70  Pulse: 76  Weight: 158 lb 11.2 oz (72 kg)    Fetal Status: Fetal Heart Rate (bpm): 147   Movement: Present     General:  Alert, oriented and cooperative. Patient is in no acute distress.  Skin: Skin is warm and dry. No rash noted.   Cardiovascular: Normal heart rate noted  Respiratory: Normal respiratory effort, no problems with respiration noted  Abdomen: Soft, gravid, appropriate for gestational age.  Pain/Pressure: Absent     Pelvic: Cervical exam deferred        Extremities: Normal range of motion.  Edema: Trace  Mental Status: Normal mood and affect. Normal behavior. Normal judgment and thought content.   Assessment and Plan:  Pregnancy: G6P0050 at [redacted]w[redacted]d 1. Encounter for supervision of low-risk pregnancy in second trimester Up to date Having some ear fullness, ear were checked and were clear. Increased fluid behind TM, encouraged the patient to take OTC allergy meds - Tdap vaccine greater than or equal to 7yo IM  2. Trichomonal vaginitis during pregnancy in first trimester TOC negative on 6/7  3. History of ELISA positive for HSV  4. Mild intermittent asthma without  complication No sx today  Preterm labor symptoms and general obstetric precautions including but not limited to vaginal bleeding, contractions, leaking of fluid and fetal movement were reviewed in detail with the patient. Please refer to After Visit Summary for other counseling recommendations.   Return in about 4 weeks (around 08/01/2021) for scheduled visit.  Future Appointments  Date Time Provider Department Center  07/18/2021  8:15 AM Federico Flake, MD [facility]  07/18/2021  8:50 AM [facility]  07/31/2021  9:55 AM Venora Maples, MD [facility]  08/12/2021  9:15 AM [facility] NURSE [facility]  08/12/2021  9:30 AM [facility] US3 [facility]  08/14/2021  1:35 PM Venora Maples, MD [facility]    Federico Flake, MD

## 2021-07-04 NOTE — Patient Instructions (Signed)
Safe Medications in Pregnancy  ? ? ?Colds/Coughs/Allergies: ?Benadryl (alcohol free) 25 mg every 6 hours as needed ?Breath right strips ?Claritin ?Cepacol throat lozenges ?Chloraseptic throat spray ?Cold-Eeze- up to three times per day ?Cough drops, alcohol free ?Flonase (by prescription only) ?Guaifenesin ?Mucinex ?Robitussin DM (plain only, alcohol free) ?Saline nasal spray/drops ?Sudafed (pseudoephedrine) & Actifed ** use only after [redacted] weeks gestation and if you do not have high blood pressure ?Tylenol ?Vicks Vaporub ?Zinc lozenges ?Zyrtec  ?

## 2021-07-18 ENCOUNTER — Ambulatory Visit (INDEPENDENT_AMBULATORY_CARE_PROVIDER_SITE_OTHER): Payer: Medicaid Other | Admitting: Family Medicine

## 2021-07-18 ENCOUNTER — Other Ambulatory Visit: Payer: Self-pay

## 2021-07-18 ENCOUNTER — Other Ambulatory Visit: Payer: Medicaid Other

## 2021-07-18 ENCOUNTER — Encounter: Payer: Self-pay | Admitting: Family Medicine

## 2021-07-18 ENCOUNTER — Other Ambulatory Visit (HOSPITAL_COMMUNITY)
Admission: RE | Admit: 2021-07-18 | Discharge: 2021-07-18 | Disposition: A | Discharge status: Home / Self Care | Payer: Medicaid Other | Source: Ambulatory Visit | Attending: Family Medicine | Admitting: Family Medicine

## 2021-07-18 ENCOUNTER — Telehealth: Payer: Self-pay | Admitting: Family Medicine

## 2021-07-18 VITALS — BP 96/59 | HR 80 | Wt 160.8 lb

## 2021-07-18 DIAGNOSIS — N898 Other specified noninflammatory disorders of vagina: Secondary | ICD-10-CM | POA: Insufficient documentation

## 2021-07-18 DIAGNOSIS — Z3492 Encounter for supervision of normal pregnancy, unspecified, second trimester: Secondary | ICD-10-CM | POA: Diagnosis present

## 2021-07-18 DIAGNOSIS — A5901 Trichomonal vulvovaginitis: Secondary | ICD-10-CM | POA: Insufficient documentation

## 2021-07-18 DIAGNOSIS — O26893 Other specified pregnancy related conditions, third trimester: Secondary | ICD-10-CM

## 2021-07-18 DIAGNOSIS — J4520 Mild intermittent asthma, uncomplicated: Secondary | ICD-10-CM

## 2021-07-18 DIAGNOSIS — O23591 Infection of other part of genital tract in pregnancy, first trimester: Secondary | ICD-10-CM

## 2021-07-18 DIAGNOSIS — Z3A28 28 weeks gestation of pregnancy: Secondary | ICD-10-CM

## 2021-07-18 DIAGNOSIS — Z3493 Encounter for supervision of normal pregnancy, unspecified, third trimester: Secondary | ICD-10-CM

## 2021-07-18 MED ORDER — FLUCONAZOLE 150 MG PO TABS: 150.0000 mg | ORAL_TABLET | Freq: Once | ORAL | 1 refills | Status: AC

## 2021-07-18 NOTE — Progress Notes (Signed)
   PRENATAL VISIT NOTE  Subjective:  Michelle [name] is a 31 y.o. G6P0050 at [redacted]w[redacted]d being seen today for ongoing prenatal care.  She is currently monitored for the following issues for this low-risk pregnancy and has Supervision of low-risk pregnancy; Mild intermittent asthma; History of ELISA positive for HSV; and Trichomonal vaginitis during pregnancy in first trimester on their problem list.  Patient reports no complaints.  Contractions: Not present. Vag. Bleeding: None.  Movement: Present. Denies leaking of fluid.   The following portions of the patient's history were reviewed and updated as appropriate: allergies, current medications, past family history, past medical history, past social history, past surgical history and problem list.   Objective:   Vitals:   07/18/21 0838  BP: (!) 96/59  Pulse: 80  Weight: 160 lb 12.8 oz (72.9 kg)    Fetal Status: Fetal Heart Rate (bpm): 145   Movement: Present     General:  Alert, oriented and cooperative. Patient is in no acute distress.  Skin: Skin is warm and dry. No rash noted.   Cardiovascular: Normal heart rate noted  Respiratory: Normal respiratory effort, no problems with respiration noted  Abdomen: Soft, gravid, appropriate for gestational age.  Pain/Pressure: Present     Pelvic: Cervical exam deferred        Extremities: Normal range of motion.     Mental Status: Normal mood and affect. Normal behavior. Normal judgment and thought content.   Assessment and Plan:  Pregnancy: G6P0050 at [redacted]w[redacted]d 1. Encounter for supervision of low-risk pregnancy in second trimester Tdap 6/8  3rd trimester labs [employer] today  2. Trichomonal vaginitis during pregnancy in first trimester TOC negative  3. Mild intermittent asthma without complication Stable sx, no concerns  4. Vaginal discharge, unresolved yeast infection suspected given appearance of discharge and itching with no odor - Cervicoancillary swab - Diflucan , repeat if not  resolved with OTC monostat  Preterm labor symptoms and general obstetric precautions including but not limited to vaginal bleeding, contractions, leaking of fluid and fetal movement were reviewed in detail with the patient. Please refer to After Visit Summary for other counseling recommendations.   Return in about 2 weeks (around 08/01/2021) for Routine prenatal care.  Future Appointments  Date Time Provider Department Center  07/18/2021  8:50 AM [facility]  07/31/2021  9:55 AM Venora Maples, MD [facility]  08/12/2021  9:15 AM [facility] NURSE [facility]  08/12/2021  9:30 AM [facility] US3 [facility]  08/14/2021  1:35 PM Venora Maples, MD [facility]  08/28/2021  2:35 PM Venora Maples, MD [facility]  09/11/2021 11:15 AM Venora Maples, MD [facility]  09/18/2021 10:35 AM Venora Maples, MD [facility]  09/25/2021 10:35 AM Venora Maples, MD [facility]    Federico Flake, MD

## 2021-07-19 LAB — GLUCOSE TOLERANCE, 2 HOURS W/ 1HR
Glucose, 1 hour: 210 mg/dL — ABNORMAL HIGH (ref 70–179)
Glucose, 2 hour: 152 mg/dL (ref 70–152)
Glucose, Fasting: 86 mg/dL (ref 70–91)

## 2021-07-19 LAB — CERVICOVAGINAL ANCILLARY ONLY
Bacterial Vaginitis (gardnerella): NEGATIVE
Candida Glabrata: NEGATIVE
Candida Vaginitis: POSITIVE — AB
Chlamydia: NEGATIVE
Comment: NEGATIVE
Comment: NEGATIVE
Comment: NEGATIVE
Comment: NEGATIVE
Comment: NEGATIVE
Comment: NORMAL
Neisseria Gonorrhea: NEGATIVE
Trichomonas: NEGATIVE

## 2021-07-19 LAB — CBC
Hematocrit: 29.8 % — ABNORMAL LOW (ref 34.0–46.6)
Hemoglobin: 9.9 g/dL — ABNORMAL LOW (ref 11.1–15.9)
MCH: 28 pg (ref 26.6–33.0)
MCHC: 33.2 g/dL (ref 31.5–35.7)
MCV: 84 fL (ref 79–97)
Platelets: 252 10*3/uL (ref 150–450)
RBC: 3.54 x10E6/uL — ABNORMAL LOW (ref 3.77–5.28)
RDW: 12.7 % (ref 11.7–15.4)
WBC: 13.1 10*3/uL — ABNORMAL HIGH (ref 3.4–10.8)

## 2021-07-19 LAB — RPR: RPR Ser Ql: NONREACTIVE

## 2021-07-19 LAB — HIV ANTIBODY (ROUTINE TESTING W REFLEX): HIV Screen 4th Generation wRfx: NONREACTIVE

## 2021-07-22 ENCOUNTER — Encounter: Payer: Self-pay | Admitting: Family Medicine

## 2021-07-22 ENCOUNTER — Other Ambulatory Visit: Payer: Self-pay | Admitting: Family Medicine

## 2021-07-22 DIAGNOSIS — O99013 Anemia complicating pregnancy, third trimester: Secondary | ICD-10-CM

## 2021-07-22 DIAGNOSIS — O24419 Gestational diabetes mellitus in pregnancy, unspecified control: Secondary | ICD-10-CM | POA: Insufficient documentation

## 2021-07-22 DIAGNOSIS — O24410 Gestational diabetes mellitus in pregnancy, diet controlled: Secondary | ICD-10-CM

## 2021-07-22 MED ORDER — FERROUS SULFATE 325 (65 FE) MG PO TABS: 325.0000 mg | ORAL_TABLET | Freq: Every day | ORAL | 3 refills | Status: DC

## 2021-07-23 ENCOUNTER — Telehealth: Payer: Self-pay | Admitting: General Practice

## 2021-07-23 DIAGNOSIS — O24410 Gestational diabetes mellitus in pregnancy, diet controlled: Secondary | ICD-10-CM

## 2021-07-23 MED ORDER — ACCU-CHEK GUIDE VI STRP: ORAL_STRIP | 12 refills | Status: DC

## 2021-07-23 MED ORDER — ACCU-CHEK SOFTCLIX LANCETS MISC: 12 refills | Status: DC

## 2021-07-23 MED ORDER — ACCU-CHEK GUIDE W/DEVICE KIT: 1.0000 | PACK | Freq: Once | 0 refills | Status: AC

## 2021-07-29 NOTE — Telephone Encounter (Signed)
Opened in error

## 2021-07-31 ENCOUNTER — Ambulatory Visit (INDEPENDENT_AMBULATORY_CARE_PROVIDER_SITE_OTHER): Payer: Medicaid Other | Admitting: Family Medicine

## 2021-07-31 ENCOUNTER — Encounter: Payer: Self-pay | Admitting: Family Medicine

## 2021-07-31 ENCOUNTER — Other Ambulatory Visit: Payer: Self-pay

## 2021-07-31 VITALS — BP 119/70 | HR 90 | Wt 165.8 lb

## 2021-07-31 DIAGNOSIS — Z3492 Encounter for supervision of normal pregnancy, unspecified, second trimester: Secondary | ICD-10-CM

## 2021-07-31 DIAGNOSIS — O24419 Gestational diabetes mellitus in pregnancy, unspecified control: Secondary | ICD-10-CM

## 2021-07-31 DIAGNOSIS — A5901 Trichomonal vulvovaginitis: Secondary | ICD-10-CM

## 2021-07-31 DIAGNOSIS — O23591 Infection of other part of genital tract in pregnancy, first trimester: Secondary | ICD-10-CM

## 2021-07-31 DIAGNOSIS — O99013 Anemia complicating pregnancy, third trimester: Secondary | ICD-10-CM

## 2021-07-31 DIAGNOSIS — K649 Unspecified hemorrhoids: Secondary | ICD-10-CM

## 2021-07-31 DIAGNOSIS — O99019 Anemia complicating pregnancy, unspecified trimester: Secondary | ICD-10-CM | POA: Insufficient documentation

## 2021-07-31 MED ORDER — PREPARATION H 0.25-14-74.9 % RE OINT: 1.0000 | TOPICAL_OINTMENT | Freq: Two times a day (BID) | RECTAL | 2 refills | Status: DC | PRN

## 2021-07-31 MED ORDER — ACCU-CHEK GUIDE W/DEVICE KIT: 1.0000 | PACK | Freq: Once | 0 refills | Status: AC

## 2021-07-31 MED ORDER — POLYETHYLENE GLYCOL 3350 17 GM/SCOOP PO POWD: 17.0000 g | Freq: Every day | ORAL | 1 refills | Status: DC | PRN

## 2021-07-31 MED ORDER — HYDROCORTISONE ACETATE 25 MG RE SUPP: 25.0000 mg | Freq: Two times a day (BID) | RECTAL | 0 refills | Status: DC

## 2021-07-31 NOTE — Progress Notes (Signed)
   Subjective:  Michelle [name] is a 31 y.o. G6P0050 at [redacted]w[redacted]d being seen today for ongoing prenatal care.  She is currently monitored for the following issues for this low-risk pregnancy and has Supervision of low-risk pregnancy; Mild intermittent asthma; History of ELISA positive for HSV; Trichomonal vaginitis during pregnancy in first trimester; Gestational diabetes mellitus (GDM); and Anemia of pregnancy on their problem list.  Patient reports no complaints.  Contractions: Not present. Vag. Bleeding: None.  Movement: Present. Denies leaking of fluid.   The following portions of the patient's history were reviewed and updated as appropriate: allergies, current medications, past family history, past medical history, past social history, past surgical history and problem list. Problem list updated.  Objective:   Vitals:   07/31/21 1047  BP: 119/70  Pulse: 90  Weight: 165 lb 12.8 oz (75.2 kg)    Fetal Status: Fetal Heart Rate (bpm): 156   Movement: Present     General:  Alert, oriented and cooperative. Patient is in no acute distress.  Skin: Skin is warm and dry. No rash noted.   Cardiovascular: Normal heart rate noted  Respiratory: Normal respiratory effort, no problems with respiration noted  Abdomen: Soft, gravid, appropriate for gestational age. Pain/Pressure: Present     Pelvic: Vag. Bleeding: None     Cervical exam deferred        Extremities: Normal range of motion.     Mental Status: Normal mood and affect. Normal behavior. Normal judgment and thought content.   Urinalysis:      Assessment and Plan:  Pregnancy: G6P0050 at [redacted]w[redacted]d  1. Encounter for supervision of low-risk pregnancy in second trimester BP and FHR normal Trial miralax and prep H for hemorrhoids  2. Gestational diabetes mellitus (GDM) in third trimester, gestational diabetes method of control unspecified Log not available, has not seen DM educator yet Scheduled for next week Has follow up growth [place] with MFM  in two weeks  3. Trichomonal vaginitis during pregnancy in first trimester Resolved, TOC neg  4. Anemia during pregnancy in third trimester PO iron prescribed  Preterm labor symptoms and general obstetric precautions including but not limited to vaginal bleeding, contractions, leaking of fluid and fetal movement were reviewed in detail with the patient. Please refer to After Visit Summary for other counseling recommendations.  Return in 2 weeks (on 08/14/2021) for Dyad patient, ob visit.   Venora Maples, MD

## 2021-07-31 NOTE — Patient Instructions (Signed)

## 2021-08-06 ENCOUNTER — Encounter: Payer: Medicaid Other | Attending: Obstetrics and Gynecology | Admitting: Registered"

## 2021-08-06 ENCOUNTER — Ambulatory Visit (INDEPENDENT_AMBULATORY_CARE_PROVIDER_SITE_OTHER): Payer: Medicaid Other | Admitting: Registered"

## 2021-08-06 ENCOUNTER — Other Ambulatory Visit: Payer: Self-pay

## 2021-08-06 DIAGNOSIS — O24410 Gestational diabetes mellitus in pregnancy, diet controlled: Secondary | ICD-10-CM | POA: Insufficient documentation

## 2021-08-06 DIAGNOSIS — O24419 Gestational diabetes mellitus in pregnancy, unspecified control: Secondary | ICD-10-CM

## 2021-08-06 DIAGNOSIS — Z713 Dietary counseling and surveillance: Secondary | ICD-10-CM | POA: Insufficient documentation

## 2021-08-06 NOTE — Progress Notes (Signed)
Patient was seen for Gestational Diabetes self-management on 08/06/2021  Start time 66 and End time 1112   Estimated due date: 10/08/21; [redacted]w[redacted]d Clinical: Medications: reviewed Medical History: Anemia, GDM Labs: OGTT 1-hr 210 mg/dL, A1c 5.4%   Dietary and Lifestyle History: Patient states her wife has diabetes and reads labels. Pt states her mother also has diabetes. Pt thought she was eating healthy. Pt states she had a lot of soda left over from her baby shower and has been drinking 2 cans per day regular, caffeinated soda, but wasn't drinking before and did not have a problem with giving it up.  Dairy: yogurt 2x/ week, milk with tea 3-4x/ week, cheese, ice cream bars daily  Physical Activity: walking and stretching Stress: not assessed Sleep: not assessed  24 hr Recall:  First Meal: vegetables, avocado toast ~12 oz juice Snack: fruit, vegetables, chips, cake Second meal: seafood mac and cheese, collard greens Snack: Third meal: left overs or breakfast foods Snack: ice cream bars Beverages: 'a lot ' of water, occasional decaf coffee, regular soda, tea with milk 3-4 x/week, almond milk.  NUTRITION INTERVENTION  Nutrition education (E-1) on the following topics:   Initial Follow-up  _0  _1  Definition of Gestational Diabetes _2  _3  Why dietary management is important in controlling blood glucose _4  _5  Effects each nutrient has on blood glucose levels _6  _7  Simple carbohydrates vs complex carbohydrates _8  _9  Fluid intake _10  _11  Creating a balanced meal plan _12  _13  Carbohydrate counting  _14  _15  When to check blood glucose levels _16  _17  Proper blood glucose monitoring techniques _18  _19  Effect of stress and stress reduction techniques  _20  _21  Exercise effect on blood glucose levels, appropriate exercise during pregnancy _22  _23  Importance of limiting caffeine and abstaining from alcohol and smoking _24  _25  Medications used for blood sugar control during  pregnancy _26  _27  Hypoglycemia and rule of 15 _28  _29  Postpartum self care  Patient already has a meter, did not bring to visit. Reviewed technique using demo meter  Patient instructed to monitor glucose levels: FBS: 60 - ? 95 mg/dL (some clinics use 90 for cutoff) 1 hour: ? 140 mg/dL 2 hour: ? 120 mg/dL  Patient received handouts: Nutrition Diabetes and Pregnancy Carbohydrate Counting List  Patient will be seen for follow-up as needed.

## 2021-08-12 ENCOUNTER — Ambulatory Visit: Payer: Medicaid Other | Attending: Obstetrics

## 2021-08-12 ENCOUNTER — Ambulatory Visit: Payer: Medicaid Other | Admitting: *Deleted

## 2021-08-12 ENCOUNTER — Other Ambulatory Visit: Payer: Self-pay | Admitting: *Deleted

## 2021-08-12 DIAGNOSIS — Z3A31 31 weeks gestation of pregnancy: Secondary | ICD-10-CM | POA: Insufficient documentation

## 2021-08-12 DIAGNOSIS — O24419 Gestational diabetes mellitus in pregnancy, unspecified control: Secondary | ICD-10-CM

## 2021-08-12 DIAGNOSIS — Z362 Encounter for other antenatal screening follow-up: Secondary | ICD-10-CM | POA: Diagnosis not present

## 2021-08-12 DIAGNOSIS — O09813 Supervision of pregnancy resulting from assisted reproductive technology, third trimester: Secondary | ICD-10-CM | POA: Diagnosis not present

## 2021-08-12 DIAGNOSIS — O24410 Gestational diabetes mellitus in pregnancy, diet controlled: Secondary | ICD-10-CM | POA: Insufficient documentation

## 2021-08-14 ENCOUNTER — Ambulatory Visit (INDEPENDENT_AMBULATORY_CARE_PROVIDER_SITE_OTHER): Payer: Medicaid Other | Admitting: Family Medicine

## 2021-08-14 ENCOUNTER — Other Ambulatory Visit: Payer: Self-pay

## 2021-08-14 ENCOUNTER — Encounter: Payer: Self-pay | Admitting: Family Medicine

## 2021-08-14 VITALS — BP 105/62 | HR 91 | Wt 162.4 lb

## 2021-08-14 DIAGNOSIS — O24419 Gestational diabetes mellitus in pregnancy, unspecified control: Secondary | ICD-10-CM

## 2021-08-14 DIAGNOSIS — Z3493 Encounter for supervision of normal pregnancy, unspecified, third trimester: Secondary | ICD-10-CM

## 2021-08-14 DIAGNOSIS — O99013 Anemia complicating pregnancy, third trimester: Secondary | ICD-10-CM

## 2021-08-14 NOTE — Progress Notes (Signed)
Fasting BS ranges from 95-105 every morning.  Michelle [name]

## 2021-08-14 NOTE — Progress Notes (Signed)
   Subjective:  Michelle [name] is a 31 y.o. G6P0050 at [redacted]w[redacted]d being seen today for ongoing prenatal care.  She is currently monitored for the following issues for this high-risk pregnancy and has Supervision of low-risk pregnancy; Mild intermittent asthma; History of ELISA positive for HSV; Gestational diabetes mellitus (GDM); and Anemia of pregnancy on their problem list.  Patient reports no complaints.  Contractions: Not present. Vag. Bleeding: None.  Movement: Present. Denies leaking of fluid.   The following portions of the patient's history were reviewed and updated as appropriate: allergies, current medications, past family history, past medical history, past social history, past surgical history and problem list. Problem list updated.  Objective:   Vitals:   08/14/21 1350  BP: 105/62  Pulse: 91  Weight: 162 lb 6.4 oz (73.7 kg)    Fetal Status: Fetal Heart Rate (bpm): 152   Movement: Present     General:  Alert, oriented and cooperative. Patient is in no acute distress.  Skin: Skin is warm and dry. No rash noted.   Cardiovascular: Normal heart rate noted  Respiratory: Normal respiratory effort, no problems with respiration noted  Abdomen: Soft, gravid, appropriate for gestational age. Pain/Pressure: Present     Pelvic: Vag. Bleeding: None     Cervical exam deferred        Extremities: Normal range of motion.     Mental Status: Normal mood and affect. Normal behavior. Normal judgment and thought content.   Urinalysis:      Assessment and Plan:  Pregnancy: G6P0050 at [redacted]w[redacted]d  1. Encounter for supervision of low-risk pregnancy in third trimester BP and FHR normal  2. Gestational diabetes mellitus (GDM) in third trimester, gestational diabetes method of control unspecified Log not brought Am fastings between 95-105, 2hr PP was 174 last night Growth [place] with MFM two days prior showed EFW 48%, AFI 14, follow up scheduled in four weeks Admits to dietary indiscretion Discussed  risks of uncontrolled GDM including macrosomia, shoulder dystocia, cesarean, labor dystocia, and stillbirth Based on recall as well as GTT result, I'm hopeful she can get sugars under control with better diet but stressed importance of adhering to diet as well as checking sugars QID Will defer meds at this time, readdress at next visit once complete log is available to review  3. Anemia during pregnancy in third trimester On PO iron, mild  Preterm labor symptoms and general obstetric precautions including but not limited to vaginal bleeding, contractions, leaking of fluid and fetal movement were reviewed in detail with the patient. Please refer to After Visit Summary for other counseling recommendations.  Return in 2 weeks (on 08/28/2021) for Dyad patient, ob visit.   Venora Maples, MD

## 2021-08-28 ENCOUNTER — Other Ambulatory Visit: Payer: Self-pay

## 2021-08-28 ENCOUNTER — Other Ambulatory Visit (HOSPITAL_COMMUNITY)
Admission: RE | Admit: 2021-08-28 | Discharge: 2021-08-28 | Disposition: A | Discharge status: Home / Self Care | Payer: Medicaid Other | Source: Ambulatory Visit | Attending: Family Medicine | Admitting: Family Medicine

## 2021-08-28 ENCOUNTER — Ambulatory Visit (INDEPENDENT_AMBULATORY_CARE_PROVIDER_SITE_OTHER): Payer: Medicaid Other | Admitting: Family Medicine

## 2021-08-28 ENCOUNTER — Encounter: Payer: Self-pay | Admitting: Family Medicine

## 2021-08-28 VITALS — BP 102/66 | HR 89 | Wt 160.6 lb

## 2021-08-28 DIAGNOSIS — O24419 Gestational diabetes mellitus in pregnancy, unspecified control: Secondary | ICD-10-CM

## 2021-08-28 DIAGNOSIS — N898 Other specified noninflammatory disorders of vagina: Secondary | ICD-10-CM | POA: Insufficient documentation

## 2021-08-28 DIAGNOSIS — Z3493 Encounter for supervision of normal pregnancy, unspecified, third trimester: Secondary | ICD-10-CM

## 2021-08-28 DIAGNOSIS — O24410 Gestational diabetes mellitus in pregnancy, diet controlled: Secondary | ICD-10-CM

## 2021-08-28 MED ORDER — ACCU-CHEK GUIDE VI STRP: ORAL_STRIP | 12 refills | Status: DC

## 2021-08-28 NOTE — Patient Instructions (Signed)

## 2021-08-28 NOTE — Progress Notes (Signed)
   Subjective:  Michelle [name] is a 31 y.o. G6P0050 at [redacted]w[redacted]d being seen today for ongoing prenatal care.  She is currently monitored for the following issues for this high-risk pregnancy and has Supervision of low-risk pregnancy; Mild intermittent asthma; History of ELISA positive for HSV; Gestational diabetes mellitus (GDM); and Anemia of pregnancy on their problem list.  Patient reports no complaints.  Contractions: Not present. Vag. Bleeding: None.  Movement: Present. Denies leaking of fluid.   The following portions of the patient's history were reviewed and updated as appropriate: allergies, current medications, past family history, past medical history, past social history, past surgical history and problem list. Problem list updated.  Objective:   Vitals:   08/28/21 1442  BP: 102/66  Pulse: 89  Weight: 160 lb 9.6 oz (72.8 kg)    Fetal Status: Fetal Heart Rate (bpm): 156   Movement: Present     General:  Alert, oriented and cooperative. Patient is in no acute distress.  Skin: Skin is warm and dry. No rash noted.   Cardiovascular: Normal heart rate noted  Respiratory: Normal respiratory effort, no problems with respiration noted  Abdomen: Soft, gravid, appropriate for gestational age. Pain/Pressure: Present     Pelvic: Vag. Bleeding: None     Cervical exam deferred        Extremities: Normal range of motion.     Mental Status: Normal mood and affect. Normal behavior. Normal judgment and thought content.   Urinalysis:      Assessment and Plan:  Pregnancy: G6P0050 at [redacted]w[redacted]d  1. Encounter for supervision of low-risk pregnancy in third trimester BP and FHR normal Self swab for possible vaginal infection  2. Gestational diabetes mellitus (GDM) in third trimester, gestational diabetes method of control unspecified Log reviewed on meter, excellent control with diet Following w MFM for growth scans, normal to date Congratulated her on good control of her sugars  Preterm labor  symptoms and general obstetric precautions including but not limited to vaginal bleeding, contractions, leaking of fluid and fetal movement were reviewed in detail with the patient. Please refer to After Visit Summary for other counseling recommendations.  Return in 2 weeks (on 09/11/2021) for Dyad patient, ob visit.   Venora Maples, MD

## 2021-08-29 LAB — CERVICOVAGINAL ANCILLARY ONLY
Bacterial Vaginitis (gardnerella): NEGATIVE
Candida Glabrata: NEGATIVE
Candida Vaginitis: NEGATIVE
Chlamydia: NEGATIVE
Comment: NEGATIVE
Comment: NEGATIVE
Comment: NEGATIVE
Comment: NEGATIVE
Comment: NEGATIVE
Comment: NORMAL
Neisseria Gonorrhea: NEGATIVE
Trichomonas: NEGATIVE

## 2021-09-11 ENCOUNTER — Other Ambulatory Visit: Payer: Self-pay

## 2021-09-11 ENCOUNTER — Other Ambulatory Visit: Payer: Self-pay | Admitting: Obstetrics

## 2021-09-11 ENCOUNTER — Ambulatory Visit: Payer: Medicaid Other | Attending: Obstetrics and Gynecology

## 2021-09-11 ENCOUNTER — Encounter: Payer: Medicaid Other | Admitting: Family Medicine

## 2021-09-11 ENCOUNTER — Encounter: Payer: Self-pay | Admitting: Pharmacist

## 2021-09-11 ENCOUNTER — Ambulatory Visit: Payer: Medicaid Other | Admitting: *Deleted

## 2021-09-11 VITALS — BP 114/65 | HR 116

## 2021-09-11 DIAGNOSIS — O09813 Supervision of pregnancy resulting from assisted reproductive technology, third trimester: Secondary | ICD-10-CM

## 2021-09-11 DIAGNOSIS — Z3A36 36 weeks gestation of pregnancy: Secondary | ICD-10-CM | POA: Diagnosis not present

## 2021-09-11 DIAGNOSIS — O24419 Gestational diabetes mellitus in pregnancy, unspecified control: Secondary | ICD-10-CM

## 2021-09-11 DIAGNOSIS — Z3689 Encounter for other specified antenatal screening: Secondary | ICD-10-CM | POA: Insufficient documentation

## 2021-09-11 DIAGNOSIS — O24410 Gestational diabetes mellitus in pregnancy, diet controlled: Secondary | ICD-10-CM

## 2021-09-14 ENCOUNTER — Inpatient Hospital Stay (HOSPITAL_COMMUNITY)
Admission: AD | Admit: 2021-09-14 | Discharge: 2021-09-14 | Disposition: A | Discharge status: Home / Self Care | Payer: Medicaid Other | Attending: Family Medicine | Admitting: Family Medicine

## 2021-09-14 ENCOUNTER — Encounter (HOSPITAL_COMMUNITY): Payer: Self-pay | Admitting: Family Medicine

## 2021-09-14 DIAGNOSIS — Z3493 Encounter for supervision of normal pregnancy, unspecified, third trimester: Secondary | ICD-10-CM

## 2021-09-14 DIAGNOSIS — O479 False labor, unspecified: Secondary | ICD-10-CM | POA: Diagnosis not present

## 2021-09-14 DIAGNOSIS — Z3A36 36 weeks gestation of pregnancy: Secondary | ICD-10-CM | POA: Diagnosis not present

## 2021-09-14 DIAGNOSIS — O4703 False labor before 37 completed weeks of gestation, third trimester: Secondary | ICD-10-CM | POA: Insufficient documentation

## 2021-09-14 DIAGNOSIS — O24419 Gestational diabetes mellitus in pregnancy, unspecified control: Secondary | ICD-10-CM

## 2021-09-14 NOTE — MAU Provider Note (Signed)
S: Ms. Yailin Biederman is a 31 y.o. G6P0050 at 100w4d who presents to MAU today for RN labor evaluation.     Cervical exam by RN:  Dilation: Closed Effacement (%): 30 Cervical Position: Posterior Station: -2 Presentation: Vertex Exam by:: K.Wilson<RN  Fetal Monitoring: Baseline: 135 bpm Variability: moderate Accelerations: 15x15 accels present Decelerations: absent Contractions: occasional  MDM Discussed patient with RN. NST reviewed.   A: SIUP at [redacted]w[redacted]d  False labor  P: Discharge home Labor precautions and kick counts included in AVS Patient to follow-up with [facility] as scheduled  Patient may return to MAU as needed or when in labor    Brand Males, CNM 09/14/2021 4:21 PM

## 2021-09-14 NOTE — MAU Note (Signed)
Michelle [name] is a 31 y.o. at [redacted]w[redacted]d here in MAU reporting: constant pressure in lower abd/pelvis and low back.  Started last night, couldn't sleep. No bleeding or LOF> reports +FM.  "Feels like her coccyx is cracking"  Onset of complaint: last night Pain score: 7 Vitals:   09/14/21 1530  BP: 117/69  Pulse: 91  Resp: 16  Temp: 98.2 F (36.8 C)  SpO2: 99%     MQK:MMNO dress, reports +FM Lab orders placed from triage:  none

## 2021-09-18 ENCOUNTER — Ambulatory Visit (INDEPENDENT_AMBULATORY_CARE_PROVIDER_SITE_OTHER): Payer: Medicaid Other | Admitting: Family Medicine

## 2021-09-18 ENCOUNTER — Other Ambulatory Visit (HOSPITAL_COMMUNITY)
Admission: RE | Admit: 2021-09-18 | Discharge: 2021-09-18 | Disposition: A | Discharge status: Home / Self Care | Payer: Medicaid Other | Source: Ambulatory Visit | Attending: Family Medicine | Admitting: Family Medicine

## 2021-09-18 ENCOUNTER — Other Ambulatory Visit: Payer: Self-pay

## 2021-09-18 ENCOUNTER — Encounter: Payer: Self-pay | Admitting: Family Medicine

## 2021-09-18 VITALS — BP 122/72 | HR 90 | Wt 166.4 lb

## 2021-09-18 DIAGNOSIS — K649 Unspecified hemorrhoids: Secondary | ICD-10-CM

## 2021-09-18 DIAGNOSIS — Z3493 Encounter for supervision of normal pregnancy, unspecified, third trimester: Secondary | ICD-10-CM

## 2021-09-18 DIAGNOSIS — O24419 Gestational diabetes mellitus in pregnancy, unspecified control: Secondary | ICD-10-CM

## 2021-09-18 MED ORDER — HYDROCORTISONE ACETATE 25 MG RE SUPP: 25.0000 mg | Freq: Two times a day (BID) | RECTAL | 0 refills | Status: DC

## 2021-09-18 NOTE — Patient Instructions (Signed)

## 2021-09-18 NOTE — Progress Notes (Signed)
   Subjective:  Michelle [name] is a 31 y.o. G6P0050 at [redacted]w[redacted]d being seen today for ongoing prenatal care.  She is currently monitored for the following issues for this high-risk pregnancy and has Supervision of low-risk pregnancy; Mild intermittent asthma; History of ELISA positive for HSV; Gestational diabetes mellitus (GDM); and Anemia of pregnancy on their problem list.  Patient reports no complaints.  Contractions: Irritability. Vag. Bleeding: None.  Movement: Present. Denies leaking of fluid.   The following portions of the patient's history were reviewed and updated as appropriate: allergies, current medications, past family history, past medical history, past social history, past surgical history and problem list. Problem list updated.  Objective:   Vitals:   09/18/21 1055  BP: 122/72  Pulse: 90  Weight: 166 lb 6.4 oz (75.5 kg)    Fetal Status: Fetal Heart Rate (bpm): 140   Movement: Present     General:  Alert, oriented and cooperative. Patient is in no acute distress.  Skin: Skin is warm and dry. No rash noted.   Cardiovascular: Normal heart rate noted  Respiratory: Normal respiratory effort, no problems with respiration noted  Abdomen: Soft, gravid, appropriate for gestational age. Pain/Pressure: Present     Pelvic: Vag. Bleeding: None     Cervical exam deferred        Extremities: Normal range of motion.     Mental Status: Normal mood and affect. Normal behavior. Normal judgment and thought content.   Urinalysis:      Assessment and Plan:  Pregnancy: G6P0050 at [redacted]w[redacted]d  1. Encounter for supervision of low-risk pregnancy in third trimester BP and FHR normal Swabs collected today - GC/Chlamydia probe amp ([place])not at [facility] - Culture, beta strep (group b only)  2. Gestational diabetes mellitus (GDM) in third trimester, gestational diabetes method of control unspecified Log reviewed, doing well Normal EFW on last [place] from 09/11/21 Plan for IOL at 39 weeks after  discussion Form faxed, orders placed   Term labor symptoms and general obstetric precautions including but not limited to vaginal bleeding, contractions, leaking of fluid and fetal movement were reviewed in detail with the patient. Please refer to After Visit Summary for other counseling recommendations.  Return in 1 week (on 09/25/2021) for Dyad patient, ob visit.   Venora Maples, MD

## 2021-09-19 LAB — GC/CHLAMYDIA PROBE AMP (~~LOC~~) NOT AT ARMC
Chlamydia: NEGATIVE
Comment: NEGATIVE
Comment: NORMAL
Neisseria Gonorrhea: NEGATIVE

## 2021-09-21 LAB — CULTURE, BETA STREP (GROUP B ONLY): Strep Gp B Culture: POSITIVE — AB

## 2021-09-23 ENCOUNTER — Encounter: Payer: Self-pay | Admitting: Family Medicine

## 2021-09-23 DIAGNOSIS — O99820 Streptococcus B carrier state complicating pregnancy: Secondary | ICD-10-CM | POA: Insufficient documentation

## 2021-09-24 ENCOUNTER — Telehealth (HOSPITAL_COMMUNITY): Payer: Self-pay | Admitting: *Deleted

## 2021-09-24 ENCOUNTER — Encounter (HOSPITAL_COMMUNITY): Payer: Self-pay

## 2021-09-24 NOTE — Telephone Encounter (Signed)
Preadmission screen  

## 2021-09-25 ENCOUNTER — Encounter (HOSPITAL_COMMUNITY): Payer: Self-pay | Admitting: *Deleted

## 2021-09-25 ENCOUNTER — Encounter: Payer: Self-pay | Admitting: Family Medicine

## 2021-09-25 ENCOUNTER — Telehealth (HOSPITAL_COMMUNITY): Payer: Self-pay | Admitting: *Deleted

## 2021-09-25 ENCOUNTER — Other Ambulatory Visit: Payer: Self-pay

## 2021-09-25 ENCOUNTER — Ambulatory Visit (INDEPENDENT_AMBULATORY_CARE_PROVIDER_SITE_OTHER): Payer: Medicaid Other | Admitting: Family Medicine

## 2021-09-25 VITALS — BP 116/77 | HR 98 | Wt 170.2 lb

## 2021-09-25 DIAGNOSIS — O99820 Streptococcus B carrier state complicating pregnancy: Secondary | ICD-10-CM

## 2021-09-25 DIAGNOSIS — O24419 Gestational diabetes mellitus in pregnancy, unspecified control: Secondary | ICD-10-CM

## 2021-09-25 DIAGNOSIS — Z3493 Encounter for supervision of normal pregnancy, unspecified, third trimester: Secondary | ICD-10-CM

## 2021-09-25 NOTE — Progress Notes (Signed)
   Subjective:  Michelle [name] is a 31 y.o. G6P0050 at [redacted]w[redacted]d being seen today for ongoing prenatal care.  She is currently monitored for the following issues for this high-risk pregnancy and has Supervision of low-risk pregnancy; Mild intermittent asthma; History of ELISA positive for HSV; Gestational diabetes mellitus (GDM); Anemia of pregnancy; and GBS (group B Streptococcus carrier), +RV culture, currently pregnant on their problem list.  Patient reports no complaints.  Contractions: Irritability. Vag. Bleeding: Bloody Show.  Movement: Present. Denies leaking of fluid.   The following portions of the patient's history were reviewed and updated as appropriate: allergies, current medications, past family history, past medical history, past social history, past surgical history and problem list. Problem list updated.  Objective:   Vitals:   09/25/21 1040  BP: 116/77  Pulse: 98  Weight: 170 lb 3.2 oz (77.2 kg)    Fetal Status: Fetal Heart Rate (bpm): 145   Movement: Present  Presentation: Vertex  General:  Alert, oriented and cooperative. Patient is in no acute distress.  Skin: Skin is warm and dry. No rash noted.   Cardiovascular: Normal heart rate noted  Respiratory: Normal respiratory effort, no problems with respiration noted  Abdomen: Soft, gravid, appropriate for gestational age. Pain/Pressure: Present     Pelvic: Vag. Bleeding: Bloody Show     Cervical exam performed Dilation: 1 Effacement (%): 70 Station: -1  Extremities: Normal range of motion.     Mental Status: Normal mood and affect. Normal behavior. Normal judgment and thought content.   Urinalysis:      Assessment and Plan:  Pregnancy: G6P0050 at [redacted]w[redacted]d  1. Encounter for supervision of low-risk pregnancy in third trimester BP and FHR normal Scheduled for IOL next week Cervix favorable at 1/70/-1, vertex, membranes swept per request  2. Gestational diabetes mellitus (GDM) in third trimester, gestational diabetes  method of control unspecified Normal growth [place] to date Scheduled for IOL next week  3. GBS (group B Streptococcus carrier), +RV culture, currently pregnant Ppx in labor  Term labor symptoms and general obstetric precautions including but not limited to vaginal bleeding, contractions, leaking of fluid and fetal movement were reviewed in detail with the patient. Please refer to After Visit Summary for other counseling recommendations.  Return in 7 weeks (on 11/13/2021) for Dyad patient, PP check.   Venora Maples, MD

## 2021-09-25 NOTE — Telephone Encounter (Signed)
Preadmission screen  

## 2021-09-28 ENCOUNTER — Other Ambulatory Visit: Payer: Self-pay | Admitting: Advanced Practice Midwife

## 2021-09-29 ENCOUNTER — Inpatient Hospital Stay (HOSPITAL_COMMUNITY)
Admission: AD | Admit: 2021-09-29 | Discharge: 2021-10-01 | DRG: 807 | Disposition: A | Discharge status: Home / Self Care | Payer: Medicaid Other | Attending: Obstetrics & Gynecology | Admitting: Obstetrics & Gynecology

## 2021-09-29 ENCOUNTER — Encounter (HOSPITAL_COMMUNITY): Payer: Self-pay | Admitting: Obstetrics and Gynecology

## 2021-09-29 ENCOUNTER — Inpatient Hospital Stay (HOSPITAL_COMMUNITY): Payer: Medicaid Other | Admitting: Anesthesiology

## 2021-09-29 ENCOUNTER — Other Ambulatory Visit: Payer: Self-pay

## 2021-09-29 DIAGNOSIS — O9952 Diseases of the respiratory system complicating childbirth: Secondary | ICD-10-CM | POA: Diagnosis present

## 2021-09-29 DIAGNOSIS — Z3A38 38 weeks gestation of pregnancy: Secondary | ICD-10-CM

## 2021-09-29 DIAGNOSIS — O24419 Gestational diabetes mellitus in pregnancy, unspecified control: Secondary | ICD-10-CM | POA: Diagnosis present

## 2021-09-29 DIAGNOSIS — O24420 Gestational diabetes mellitus in childbirth, diet controlled: Principal | ICD-10-CM | POA: Diagnosis present

## 2021-09-29 DIAGNOSIS — O9902 Anemia complicating childbirth: Secondary | ICD-10-CM | POA: Diagnosis not present

## 2021-09-29 DIAGNOSIS — O99824 Streptococcus B carrier state complicating childbirth: Secondary | ICD-10-CM | POA: Diagnosis not present

## 2021-09-29 DIAGNOSIS — O26893 Other specified pregnancy related conditions, third trimester: Secondary | ICD-10-CM | POA: Diagnosis not present

## 2021-09-29 DIAGNOSIS — O24429 Gestational diabetes mellitus in childbirth, unspecified control: Secondary | ICD-10-CM | POA: Diagnosis not present

## 2021-09-29 DIAGNOSIS — J45909 Unspecified asthma, uncomplicated: Secondary | ICD-10-CM | POA: Diagnosis not present

## 2021-09-29 DIAGNOSIS — J4520 Mild intermittent asthma, uncomplicated: Secondary | ICD-10-CM | POA: Diagnosis present

## 2021-09-29 DIAGNOSIS — O479 False labor, unspecified: Secondary | ICD-10-CM | POA: Diagnosis present

## 2021-09-29 DIAGNOSIS — Z3493 Encounter for supervision of normal pregnancy, unspecified, third trimester: Secondary | ICD-10-CM

## 2021-09-29 LAB — CBC
HCT: 37.6 % (ref 36.0–46.0)
Hemoglobin: 12.1 g/dL (ref 12.0–15.0)
MCH: 27.1 pg (ref 26.0–34.0)
MCHC: 32.2 g/dL (ref 30.0–36.0)
MCV: 84.1 fL (ref 80.0–100.0)
Platelets: 229 10*3/uL (ref 150–400)
RBC: 4.47 MIL/uL (ref 3.87–5.11)
RDW: 16.9 % — ABNORMAL HIGH (ref 11.5–15.5)
WBC: 13.5 10*3/uL — ABNORMAL HIGH (ref 4.0–10.5)
nRBC: 0 % (ref 0.0–0.2)

## 2021-09-29 LAB — PROTEIN / CREATININE RATIO, URINE
Creatinine, Urine: 140 mg/dL
Protein Creatinine Ratio: 0.21 mg/mg{Cre} — ABNORMAL HIGH (ref 0.00–0.15)
Total Protein, Urine: 29 mg/dL

## 2021-09-29 LAB — TYPE AND SCREEN
ABO/RH(D): O POS
Antibody Screen: NEGATIVE

## 2021-09-29 LAB — COMPREHENSIVE METABOLIC PANEL
ALT: 18 U/L (ref 0–44)
AST: 21 U/L (ref 15–41)
Albumin: 2.7 g/dL — ABNORMAL LOW (ref 3.5–5.0)
Alkaline Phosphatase: 184 U/L — ABNORMAL HIGH (ref 38–126)
Anion gap: 8 (ref 5–15)
BUN: <5 mg/dL — ABNORMAL LOW (ref 6–20)
CO2: 19 mmol/L — ABNORMAL LOW (ref 22–32)
Calcium: 8.8 mg/dL — ABNORMAL LOW (ref 8.9–10.3)
Chloride: 108 mmol/L (ref 98–111)
Creatinine, Ser: 0.52 mg/dL (ref 0.44–1.00)
GFR, Estimated: >60 mL/min (ref >60)
Glucose, Bld: 103 mg/dL — ABNORMAL HIGH (ref 70–99)
Potassium: 3.5 mmol/L (ref 3.5–5.1)
Sodium: 135 mmol/L (ref 135–145)
Total Bilirubin: 0.4 mg/dL (ref 0.3–1.2)
Total Protein: 6.5 g/dL (ref 6.5–8.1)

## 2021-09-29 LAB — GLUCOSE, CAPILLARY
Glucose-Capillary: 109 mg/dL — ABNORMAL HIGH (ref 70–99)
Glucose-Capillary: 85 mg/dL (ref 70–99)
Glucose-Capillary: 114 mg/dL — ABNORMAL HIGH (ref 70–99)

## 2021-09-29 LAB — RPR: RPR Ser Ql: NONREACTIVE

## 2021-09-29 MED ORDER — ONDANSETRON HCL 4 MG/2ML IJ SOLN: 4.0000 mg | Freq: Four times a day (QID) | INTRAMUSCULAR | Status: DC | PRN

## 2021-09-29 MED ORDER — LIDOCAINE HCL (PF) 1 % IJ SOLN: 30.0000 mL | INTRAMUSCULAR | Status: DC | PRN

## 2021-09-29 MED ORDER — PHENYLEPHRINE 80 MCG/ML (10ML) SYRINGE FOR IV PUSH (FOR BLOOD PRESSURE SUPPORT)
80.0000 ug | PREFILLED_SYRINGE | INTRAVENOUS | Status: DC | PRN
Start: 2021-09-29 — End: 2021-09-29

## 2021-09-29 MED ORDER — PRENATAL MULTIVITAMIN CH
1.0000 | ORAL_TABLET | Freq: Every day | ORAL | Status: DC
  Administered 2021-09-30 – 2021-10-01 (×2): 1 via ORAL
  Filled 2021-09-29 (×2): qty 1

## 2021-09-29 MED ORDER — OXYTOCIN-SODIUM CHLORIDE 30-0.9 UT/500ML-% IV SOLN: 2.5000 [IU]/h | INTRAVENOUS | Status: DC

## 2021-09-29 MED ORDER — FENTANYL-BUPIVACAINE-NACL 0.5-0.125-0.9 MG/250ML-% EP SOLN
12.0000 mL/h | EPIDURAL | Status: DC | PRN
  Administered 2021-09-29: 12 mL/h via EPIDURAL
  Filled 2021-09-29: qty 250

## 2021-09-29 MED ORDER — SOD CITRATE-CITRIC ACID 500-334 MG/5ML PO SOLN: 30.0000 mL | ORAL | Status: DC | PRN

## 2021-09-29 MED ORDER — ONDANSETRON HCL 4 MG PO TABS: 4.0000 mg | ORAL_TABLET | ORAL | Status: DC | PRN

## 2021-09-29 MED ORDER — PHENYLEPHRINE 80 MCG/ML (10ML) SYRINGE FOR IV PUSH (FOR BLOOD PRESSURE SUPPORT): 80.0000 ug | PREFILLED_SYRINGE | INTRAVENOUS | Status: DC | PRN

## 2021-09-29 MED ORDER — IBUPROFEN 600 MG PO TABS
600.0000 mg | ORAL_TABLET | Freq: Four times a day (QID) | ORAL | Status: DC
  Administered 2021-09-30 – 2021-10-01 (×7): 600 mg via ORAL
  Filled 2021-09-29 (×6): qty 1

## 2021-09-29 MED ORDER — DIPHENHYDRAMINE HCL 25 MG PO CAPS: 25.0000 mg | ORAL_CAPSULE | Freq: Four times a day (QID) | ORAL | Status: DC | PRN

## 2021-09-29 MED ORDER — LACTATED RINGERS IV SOLN: 500.0000 mL | INTRAVENOUS | Status: DC | PRN

## 2021-09-29 MED ORDER — PENICILLIN G POT IN DEXTROSE 60000 UNIT/ML IV SOLN
3.0000 10*6.[IU] | INTRAVENOUS | Status: DC
  Administered 2021-09-29 (×2): 3 10*6.[IU] via INTRAVENOUS
  Filled 2021-09-29 (×2): qty 50

## 2021-09-29 MED ORDER — OXYTOCIN-SODIUM CHLORIDE 30-0.9 UT/500ML-% IV SOLN
1.0000 m[IU]/min | INTRAVENOUS | Status: DC
  Administered 2021-09-29: 2 m[IU]/min via INTRAVENOUS

## 2021-09-29 MED ORDER — COCONUT OIL OIL: 1.0000 | TOPICAL_OIL | Status: DC | PRN

## 2021-09-29 MED ORDER — LACTATED RINGERS IV SOLN: INTRAVENOUS | Status: DC

## 2021-09-29 MED ORDER — WITCH HAZEL-GLYCERIN EX PADS: 1.0000 | MEDICATED_PAD | CUTANEOUS | Status: DC | PRN

## 2021-09-29 MED ORDER — SIMETHICONE 80 MG PO CHEW: 80.0000 mg | CHEWABLE_TABLET | ORAL | Status: DC | PRN

## 2021-09-29 MED ORDER — SODIUM CHLORIDE 0.9 % IV SOLN
5.0000 10*6.[IU] | Freq: Once | INTRAVENOUS | Status: AC
  Administered 2021-09-29: 5 10*6.[IU] via INTRAVENOUS
  Filled 2021-09-29: qty 5

## 2021-09-29 MED ORDER — OXYTOCIN-SODIUM CHLORIDE 30-0.9 UT/500ML-% IV SOLN
2.5000 [IU]/h | INTRAVENOUS | Status: DC
  Filled 2021-09-29: qty 500

## 2021-09-29 MED ORDER — TERBUTALINE SULFATE 1 MG/ML IJ SOLN: 0.2500 mg | Freq: Once | INTRAMUSCULAR | Status: DC | PRN

## 2021-09-29 MED ORDER — DIBUCAINE (PERIANAL) 1 % EX OINT
1.0000 | TOPICAL_OINTMENT | CUTANEOUS | Status: DC | PRN
  Administered 2021-09-30: 1 via RECTAL
  Filled 2021-09-29: qty 28

## 2021-09-29 MED ORDER — DIPHENHYDRAMINE HCL 50 MG/ML IJ SOLN: 12.5000 mg | INTRAMUSCULAR | Status: DC | PRN

## 2021-09-29 MED ORDER — SENNOSIDES-DOCUSATE SODIUM 8.6-50 MG PO TABS
2.0000 | ORAL_TABLET | Freq: Every day | ORAL | Status: DC
  Administered 2021-09-30 – 2021-10-01 (×2): 2 via ORAL
  Filled 2021-09-29 (×2): qty 2

## 2021-09-29 MED ORDER — OXYTOCIN BOLUS FROM INFUSION: 333.0000 mL | Freq: Once | INTRAVENOUS | Status: DC

## 2021-09-29 MED ORDER — OXYTOCIN BOLUS FROM INFUSION
333.0000 mL | Freq: Once | INTRAVENOUS | Status: AC
  Administered 2021-09-29: 333 mL via INTRAVENOUS

## 2021-09-29 MED ORDER — EPHEDRINE 5 MG/ML INJ: 10.0000 mg | INTRAVENOUS | Status: DC | PRN

## 2021-09-29 MED ORDER — ACETAMINOPHEN 325 MG PO TABS: 650.0000 mg | ORAL_TABLET | ORAL | Status: DC | PRN

## 2021-09-29 MED ORDER — BENZOCAINE-MENTHOL 20-0.5 % EX AERO
1.0000 | INHALATION_SPRAY | CUTANEOUS | Status: DC | PRN
  Administered 2021-09-30: 1 via TOPICAL
  Filled 2021-09-29: qty 56

## 2021-09-29 MED ORDER — LIDOCAINE-EPINEPHRINE (PF) 2 %-1:200000 IJ SOLN
INTRAMUSCULAR | Status: DC | PRN
  Administered 2021-09-29: 5 mL via EPIDURAL

## 2021-09-29 MED ORDER — ACETAMINOPHEN 325 MG PO TABS
650.0000 mg | ORAL_TABLET | ORAL | Status: DC | PRN
  Administered 2021-09-30: 650 mg via ORAL
  Filled 2021-09-29: qty 2

## 2021-09-29 MED ORDER — FENTANYL CITRATE (PF) 100 MCG/2ML IJ SOLN: 50.0000 ug | INTRAMUSCULAR | Status: DC | PRN

## 2021-09-29 MED ORDER — ONDANSETRON HCL 4 MG/2ML IJ SOLN: 4.0000 mg | INTRAMUSCULAR | Status: DC | PRN

## 2021-09-29 MED ORDER — LACTATED RINGERS IV SOLN
500.0000 mL | Freq: Once | INTRAVENOUS | Status: AC
  Administered 2021-09-29: 500 mL via INTRAVENOUS

## 2021-09-29 MED ORDER — OXYCODONE-ACETAMINOPHEN 5-325 MG PO TABS: 2.0000 | ORAL_TABLET | ORAL | Status: DC | PRN

## 2021-09-29 MED ORDER — OXYCODONE-ACETAMINOPHEN 5-325 MG PO TABS: 1.0000 | ORAL_TABLET | ORAL | Status: DC | PRN

## 2021-09-29 NOTE — Anesthesia Preprocedure Evaluation (Addendum)
Anesthesia Evaluation  Patient identified by MRN, date of birth, ID band Patient awake    Reviewed: Allergy & Precautions, Patient's Chart, lab work & pertinent test results  Airway Mallampati: I       Dental   Pulmonary asthma ,    Pulmonary exam normal        Cardiovascular negative cardio ROS Normal cardiovascular exam     Neuro/Psych negative neurological ROS  negative psych ROS   GI/Hepatic   Endo/Other  diabetes, Gestational  Renal/GU      Musculoskeletal negative musculoskeletal ROS (+)   Abdominal   Peds  Hematology   Anesthesia Other Findings   Reproductive/Obstetrics (+) Pregnancy                            Anesthesia Physical Anesthesia Plan  ASA: 2  Anesthesia Plan: Epidural   Post-op Pain Management:    Induction:   PONV Risk Score and Plan:   Airway Management Planned:   Additional Equipment: None  Intra-op Plan:   Post-operative Plan:   Informed Consent: I have reviewed the patients History and Physical, chart, labs and discussed the procedure including the risks, benefits and alternatives for the proposed anesthesia with the patient or authorized representative who has indicated his/her understanding and acceptance.       Plan Discussed with:   Anesthesia Plan Comments: (Lab Results      Component                Value               Date                      WBC                      13.5 (H)            09/29/2021                HGB                      12.1                09/29/2021                HCT                      37.6                09/29/2021                MCV                      84.1                09/29/2021                PLT                      229                 09/29/2021           )       Anesthesia Quick Evaluation

## 2021-09-29 NOTE — Lactation Note (Signed)
This note was copied from a baby's chart. Lactation Consultation Note  Patient Name: Michelle [name] [id number] Date: 09/29/2021 Reason for consult: L&D Initial assessment;Mother's request;Primapara;1st time breastfeeding;Early term 37-38.6wks;Breastfeeding assistance;Maternal endocrine disorder Age:29 hours  LC assisted with latch with signs of milk transfer. Infant still feeding at the end of the visit. Birth parent to receive further support on the floor.  All questions answered at the end of the visit.   Maternal Data Does the patient have breastfeeding experience prior to this delivery?: No  Feeding Mother's Current Feeding Choice: Breast Milk  LATCH Score Latch: Repeated attempts needed to sustain latch, nipple held in mouth throughout feeding, stimulation needed to elicit sucking reflex.  Audible Swallowing: Spontaneous and intermittent  Type of Nipple: Everted at rest and after stimulation  Comfort (Breast/Nipple): Soft / non-tender  Hold (Positioning): Assistance needed to correctly position infant at breast and maintain latch.  LATCH Score: 8   Lactation Tools Discussed/Used    Interventions Interventions: Breast feeding basics reviewed;Assisted with latch;Skin to skin;Adjust position;Support pillows;Education  Discharge    Consult Status Consult Status: Follow-up from L&D Date: 09/30/21 Follow-up type: In-patient    Adonai Selsor  Nicholson-Springer 09/29/2021, 7:48 PM

## 2021-09-29 NOTE — Progress Notes (Signed)
Patient ID: [name], female   DOB: May 28, 1990, 31 y.o.   MRN: [id number]  Labor Progress Note [name] is a 31 y.o. G6P0050 at [redacted]w[redacted]d presented for spontaneous onset of labor  S:  Resting in bed with epidural and family at bedside for support, feeling more lower abdominal cramping with contractions.  O:  BP 118/76   Pulse 85   Temp 98.2 F (36.8 C) (Oral)   Resp 16   LMP  (LMP Unknown)   SpO2 100%  EFM: baseline 130 bpm/ moderate variability/ 15x15 accels/ no decels  Toco/IUPC: q4-59min SVE: Dilation: 8 Effacement (%): 100 Station: 0 Presentation: Vertex Exam by:: [name], CNM Pitocin: None, will start  A/P: 31 y.o. G6P0050 [redacted]w[redacted]d  1. Labor: Active labor, cervix now tight again and off center (pointing toward maternal left). Will begin Pitocin titration and start regular position changes with the peanut ball. 2. FWB: Cat 1 3. Pain: Well controlled with epidural 4. A1GDM: q4 CBG  Encouraged use of peanut ball on alternating sides and throne position with knees together and feet in a wide stance. Anticipate SVD.  [name], CNM 5:09 PM

## 2021-09-29 NOTE — Anesthesia Procedure Notes (Signed)
Epidural Patient location during procedure: OB Start time: 09/29/2021 8:43 AM End time: 09/29/2021 8:49 AM  Staffing Anesthesiologist: Shelton Silvas, MD Performed: anesthesiologist   Preanesthetic Checklist Completed: patient identified, IV checked, site marked, risks and benefits discussed, surgical consent, monitors and equipment checked, pre-op evaluation and timeout performed  Epidural Patient position: sitting Prep: DuraPrep Patient monitoring: heart rate, continuous pulse ox and blood pressure Approach: midline Location: L3-L4 Injection technique: LOR saline  Needle:  Needle type: Tuohy  Needle gauge: 17 G Needle length: 9 cm Catheter type: closed end flexible Catheter size: 20 Guage Test dose: negative and 1.5% lidocaine  Assessment Events: blood not aspirated, injection not painful, no injection resistance and no paresthesia  Additional Notes LOR @ 5  Patient identified. Risks/Benefits/Options discussed with patient including but not limited to bleeding, infection, nerve damage, paralysis, failed block, incomplete pain control, headache, blood pressure changes, nausea, vomiting, reactions to medications, itching and postpartum back pain. Confirmed with bedside nurse the patient's most recent platelet count. Confirmed with patient that they are not currently taking any anticoagulation, have any bleeding history or any family history of bleeding disorders. Patient expressed understanding and wished to proceed. All questions were answered. Sterile technique was used throughout the entire procedure. Please see nursing notes for vital signs. Test dose was given through epidural catheter and negative prior to continuing to dose epidural or start infusion. Warning signs of high block given to the patient including shortness of breath, tingling/numbness in hands, complete motor block, or any concerning symptoms with instructions to call for help. Patient was given instructions on fall  risk and not to get out of bed. All questions and concerns addressed with instructions to call with any issues or inadequate analgesia.    Reason for block:procedure for pain

## 2021-09-29 NOTE — Progress Notes (Signed)
Patient ID: Michelle [name], female   DOB: 1990-02-01, 31 y.o.   MRN: [id number]  Labor Progress Note Michelle [name] is a 31 y.o. G6P0050 at [redacted]w[redacted]d presented for spontaneous onset of labor  S:  Resting in bed with epidural and family at bedside for support  O:  BP 115/61   Pulse 82   Temp 98.6 F (37 C) (Axillary)   Resp 20   LMP  (LMP Unknown)   SpO2 100%  EFM: baseline 130 bpm/ moderate variability/ 15x15 accels/ no decels  Toco/IUPC: q4-59min SVE: Dilation: 8 Effacement (%): 100 Station: 0 Presentation: Vertex Exam by:: Edd Arbour CNM Pitocin: None  A/P: 31 y.o. G6P0050 [redacted]w[redacted]d  1. Labor: Active labor, AROM performed with clear return of fluid. Cervix was 6cm, 80% effaced with tightly BBOW but opened to a stretchy soft 100% effaced 8cm after AROM. Pt repositioned to semi-reclined 2. FWB: Cat 1 3. Pain: Well controlled with epidural 4. A1GDM: q4 CBG  Encouraged regular position changes. Anticipate SVD.  [name], CNM 1:11 PM

## 2021-09-29 NOTE — Discharge Summary (Shared)
Postpartum Discharge Summary  Date of Service updated***     Patient Name: Michelle [name] DOB: Apr 11, 1990 MRN: [id number]  Date of admission: 09/29/2021 Delivery date:09/29/2021  Delivering provider: Gaylan Gerold R  Date of discharge: 09/29/2021  Admitting diagnosis: Uterine contractions during pregnancy [O47.9] Gestational diabetes mellitus (GDM) [O24.419] Intrauterine pregnancy: [redacted]w[redacted]d    Secondary diagnosis:  Principal Problem:   Uterine contractions during pregnancy Active Problems:   Gestational diabetes mellitus (GDM)  Additional problems: None    Discharge diagnosis: Term Pregnancy Delivered and GDM A1                                              Post partum procedures: None Augmentation: AROM and Pitocin Complications: None  Hospital course: Onset of Labor With Vaginal Delivery      31y.o. yo G6P0050 at [id number] admitted in Active Labor on 09/29/2021. Patient had an uncomplicated labor course as follows:  Membrane Rupture Time/Date: 1:00 PM ,09/29/2021   Delivery Method:Vaginal, Spontaneous  Episiotomy: None  Lacerations:  1st degree;Perineal  Patient had an uncomplicated postpartum course.  She is ambulating, tolerating a regular diet, passing flatus, and urinating well. Patient is discharged home in stable condition on 09/29/21.  Newborn Data: Birth date:09/29/2021  Birth time:7:10 PM  Gender:Female  Living status:Living  Apgars:9 ,9  Weight:   Magnesium Sulfate received: No BMZ received: No Rhophylac:N/A MMR:N/A T-DaP:Given prenatally Flu: N/A Transfusion:No  Physical exam  Vitals:   09/29/21 1813 09/29/21 1831 09/29/21 1930 09/29/21 1945  BP: 132/75 (!) 118/59 116/77 99/62  Pulse: 80 (!) 114 (!) 147 (!) 158  Resp: 20 20 20 17   Temp:      TempSrc:      SpO2:       General: {Exam; general:21111117} Lochia: {Desc; appropriate/inappropriate:30686::"appropriate"} Uterine Fundus: {Desc; firm/soft:30687} Incision: {Exam; incision:21111123} DVT  Evaluation: {Exam; dvt:2111122} Labs: Lab Results  Component Value Date   WBC 13.5 (H) 09/29/2021   HGB 12.1 09/29/2021   HCT 37.6 09/29/2021   MCV 84.1 09/29/2021   PLT 229 09/29/2021      Latest Ref Rng & Units 09/29/2021    8:00 AM  CMP  Glucose 70 - 99 mg/dL 103   BUN 6 - 20 mg/dL <5   Creatinine 0.44 - 1.00 mg/dL 0.52   Sodium 135 - 145 mmol/L 135   Potassium 3.5 - 5.1 mmol/L 3.5   Chloride 98 - 111 mmol/L 108   CO2 22 - 32 mmol/L 19   Calcium 8.9 - 10.3 mg/dL 8.8   Total Protein 6.5 - 8.1 g/dL 6.5   Total Bilirubin 0.3 - 1.2 mg/dL 0.4   Alkaline Phos 38 - 126 U/L 184   AST 15 - 41 U/L 21   ALT 0 - 44 U/L 18    Edinburgh Score:     No data to display           After visit meds:  Allergies as of 09/29/2021   No Active Allergies   Med Rec must be completed [facility] to [facility]      Discharge home in stable condition Infant Feeding: {Baby feeding:23562} Infant Disposition:{CHL IP OB HOME WITH MOYWVPXT:06269}ischarge instruction: per After Visit Summary and Postpartum booklet. Activity: Advance as tolerated. Pelvic rest for 6 weeks.  Diet: {OB diSWNI:62703500}uture Appointments: Future Appointments  Date Time Provider  [place]  10/02/2021  2:55 PM Caren Macadam, MD [facility]   Follow up Visit: Message sent to [facility] on 09/29/21 by Gaylan Gerold, CNM Please schedule this patient for a In person postpartum visit in 4 weeks with the following provider:  MB Provider . Additional Postpartum F/U:2 hour GTT  High risk pregnancy complicated by: GDM Delivery mode:  Vaginal, Spontaneous  Anticipated Birth Control:   None (same sex relationship)   09/29/2021 Gabriel Carina, CNM

## 2021-09-29 NOTE — Lactation Note (Signed)
This note was copied from a baby's chart. Lactation Consultation Note  Patient Name: Girl Odean Mcelwain [id number] Date: 09/29/2021 Reason for consult: Initial assessment Age:29 hours  Mother states baby recently breastfed for 15 min. Baby has breastfed x 2 since birth.  Feed on demand with cues.  Goal 8-12+ times per day after first 24 hrs.  Place baby STS if not cueing.  Mom made aware of O/P services, breastfeeding support group and our phone # for post-discharge questions.   Maternal Data Has patient been taught Hand Expression?: Yes Does the patient have breastfeeding experience prior to this delivery?: No  Feeding Mother's Current Feeding Choice: Breast Milk  LATCH Score Latch: Repeated attempts needed to sustain latch, nipple held in mouth throughout feeding, stimulation needed to elicit sucking reflex.  Audible Swallowing: A few with stimulation  Type of Nipple: Everted at rest and after stimulation  Comfort (Breast/Nipple): Soft / non-tender  Hold (Positioning): No assistance needed to correctly position infant at breast.  LATCH Score: 8  Interventions Interventions: Education;LC Services brochure  ult Status Consult Status: Follow-up Date: 09/30/21 Follow-up type: In-patient    Dahlia Byes [facility] 09/29/2021, 10:46 PM

## 2021-09-29 NOTE — MAU Note (Signed)
Patient arrived to MAU complaining of ctx that started at 519am. Np leakage of fluid, no vaginal bleeding + FM reported  Temp 98.8 Bp 133/97 Hr 95  Sat 100% FHR 130

## 2021-09-29 NOTE — H&P (Signed)
OBSTETRIC ADMISSION HISTORY AND PHYSICAL  Michelle [name] is a 31 y.o. female [phone] with IVF IUP at [redacted]w[redacted]d confirmed by 13w U/S presenting for spontaneous onset of labor. She reports +FMs, No LOF, no VB, no blurry vision, headaches or peripheral edema, and RUQ pain.  She plans on breast feeding. She request nothing for birth control (same sex relationship). She received her prenatal care at  [facility].    Dating: By 13w U/S --->  Estimated Date of Delivery: 10/08/21  Sono:    @[redacted]w[redacted]d , CWD, normal anatomy, cephalic presentation,  123456, 74% EFW 6lb 13oz   Prenatal History/Complications: [id number], otherwise normal  Past Medical History: Past Medical History:  Diagnosis Date   Asthma    Gestational diabetes    Herpes simplex conjunctivitis of both eyes    Past Surgical History: Past Surgical History:  Procedure Laterality Date   FRACTURE SURGERY Right    leg   Obstetrical History: OB History     Gravida  6   Para      Term      Preterm      AB  5   Living         SAB      IAB      Ectopic      Multiple      Live Births             Social History Social History   Socioeconomic History   Marital status: Significant Other    Spouse name: Not on file   Number of children: Not on file   Years of education: Not on file   Highest education level: Not on file  Occupational History   Not on file  Tobacco Use   Smoking status: Never   Smokeless tobacco: Never  Vaping Use   Vaping Use: Never used  Substance and Sexual Activity   Alcohol use: Not Currently    Comment: daily until stopped when found our ptregnant   Drug use: Not Currently    Types: Marijuana    Comment: 03/26/21 uses seldom   Sexual activity: Yes    Birth control/protection: None  Other Topics Concern   Not on file  Social History Narrative   Not on file   Social Determinants of Health   Financial Resource Strain: Not on file  Food Insecurity: Food Insecurity Present (07/18/2021)    Hunger Vital Sign    Worried About Running Out of Food in the Last Year: Sometimes true    Ran Out of Food in the Last Year: Sometimes true  Transportation Needs: No Transportation Needs (07/18/2021)   PRAPARE - Hydrologist (Medical): No    Lack of Transportation (Non-Medical): No  Physical Activity: Not on file  Stress: Not on file  Social Connections: Not on file   Family History: Family History  Adopted: Yes  Problem Relation Age of Onset   Healthy Mother    Healthy Father    Allergies: No Active Allergies  Medications Prior to Admission  Medication Sig Dispense Refill Last Dose   albuterol (PROAIR HFA) 108 (90 Base) MCG/ACT inhaler Inhale 1-2 puffs into the lungs every 6 (six) hours as needed for wheezing or shortness of breath. 8 g 1 09/28/2021   ferrous sulfate (FERROUSUL) 325 (65 FE) MG tablet Take 1 tablet (325 mg total) by mouth daily with breakfast. 60 tablet 3 09/28/2021   fluticasone (FLOVENT HFA) 110 MCG/ACT inhaler Inhale 1 puff into the lungs  daily. 1 each 12 09/28/2021   Prenatal Vit-Fe Fumarate-FA (PRENATAL VITAMIN PO) Take 1 tablet by mouth daily.   09/28/2021   Accu-Chek Softclix Lancets lancets Use as instructed QID 100 each 12    fluticasone (FLONASE) 50 MCG/ACT nasal spray Place 1 spray into both nostrils daily. (Patient not taking: Reported on 09/29/2021) 18.2 mL 2 Not Taking   glucose blood (ACCU-CHEK GUIDE) test strip Use as instructed QID 100 each 12    hydrocortisone (ANUSOL-HC) 25 MG suppository Place 1 suppository (25 mg total) rectally 2 (two) times daily. (Patient not taking: Reported on 09/25/2021) 12 suppository 0 Not Taking   loratadine (CLARITIN) 10 MG tablet Take 1 tablet (10 mg total) by mouth daily. (Patient not taking: Reported on 09/29/2021) 30 tablet 5 Not Taking   polyethylene glycol powder (GLYCOLAX/MIRALAX) 17 GM/SCOOP powder Take 17 g by mouth daily as needed. (Patient not taking: Reported on 09/29/2021) 510 g 1 Not Taking    Review of Systems  All systems reviewed and negative except as stated in HPI  Blood pressure 107/65, pulse 94, resp. rate 20, SpO2 100 %. General appearance: alert, cooperative, appears stated age, and no distress Lungs: clear to auscultation bilaterally Heart: regular rate and rhythm Abdomen: soft, non-tender; bowel sounds normal Pelvic: Normal external female genitalia, physiologic mucus, no bloody show Extremities: Homans sign is negative, no sign of DVT DTR's normal Presentation: cephalic Fetal monitoring: Baseline: 120 bpm, Variability: Good {> 6 bpm), Accelerations: Reactive, and Decelerations: Absent Uterine activity: Date/time of onset: 0500, Frequency: Every 3-4 minutes, Duration: 60 seconds, and Intensity: strong Dilation: 6 (BBOW) Effacement (%): 90 Station: -1 Exam by:: Dan Humphreys, CNM  Prenatal labs: ABO, Rh: --/--/O POS (09/03 0800) Antibody: NEG (09/03 0800) Rubella: 5.81 (03/07 1045) RPR: Non Reactive (06/22 0920)  HBsAg: Negative (03/07 1045)  HIV: Non Reactive (06/22 0920)  GBS: Positive/-- (08/23 1138)  2hr GTT: 86/210/152 (abnormal) Genetic screening: negative NIPS, normal carrier screen, normal AFP Anatomy US: normal  Prenatal Transfer Tool  Maternal Diabetes: Yes:  Diabetes Type:  Diet controlled Genetic Screening: Normal Maternal Ultrasounds/Referrals: Normal Fetal Ultrasounds or other Referrals:  Referred to Materal Fetal Medicine  Maternal Substance Abuse:  No Significant Maternal Medications:  None Significant Maternal Lab Results:  Group B Strep positive Number of Prenatal Visits:greater than 3 verified prenatal visits Other Comments:  None  Results for orders placed or performed during the hospital encounter of 09/29/21 (from the past 24 hour(s))  Comprehensive metabolic panel   Collection Time: 09/29/21  8:00 AM  Result Value Ref Range   Sodium 135 135 - 145 mmol/L   Potassium 3.5 3.5 - 5.1 mmol/L   Chloride 108 98 - 111 mmol/L   CO2 19  (L) 22 - 32 mmol/L   Glucose, Bld 103 (H) 70 - 99 mg/dL   BUN <5 (L) 6 - 20 mg/dL   Creatinine, Ser 1.01 0.44 - 1.00 mg/dL   Calcium 8.8 (L) 8.9 - 10.3 mg/dL   Total Protein 6.5 6.5 - 8.1 g/dL   Albumin 2.7 (L) 3.5 - 5.0 g/dL   AST 21 15 - 41 U/L   ALT 18 0 - 44 U/L   Alkaline Phosphatase 184 (H) 38 - 126 U/L   Total Bilirubin 0.4 0.3 - 1.2 mg/dL   GFR, Estimated >75 >10 mL/min   Anion gap 8 5 - 15  CBC   Collection Time: 09/29/21  8:00 AM  Result Value Ref Range   WBC 13.5 (H) 4.0 - 10.5 K/uL  RBC 4.47 3.87 - 5.11 MIL/uL   Hemoglobin 12.1 12.0 - 15.0 g/dL   HCT 21.1 94.1 - 74.0 %   MCV 84.1 80.0 - 100.0 fL   MCH 27.1 26.0 - 34.0 pg   MCHC 32.2 30.0 - 36.0 g/dL   RDW 81.4 (H) 48.1 - 85.6 %   Platelets 229 150 - 400 K/uL   nRBC 0.0 0.0 - 0.2 %  Type and screen   Collection Time: 09/29/21  8:00 AM  Result Value Ref Range   ABO/RH(D) O POS    Antibody Screen NEG    Sample Expiration      10/02/2021,2359 Performed at [facility], [address] [place], [place] [address]   Glucose, capillary   Collection Time: 09/29/21  9:24 AM  Result Value Ref Range   Glucose-Capillary 109 (H) 70 - 99 mg/dL   Comment 1 Notify RN    Comment 2 Document in Chart     Patient Active Problem List   Diagnosis Date Noted   Uterine contractions during pregnancy 09/29/2021   GBS (group B Streptococcus carrier), +RV culture, currently pregnant 09/23/2021   Anemia of pregnancy 07/31/2021   Gestational diabetes mellitus (GDM) 07/22/2021   History of ELISA positive for HSV 04/02/2021   Mild intermittent asthma 03/27/2021   Supervision of low-risk pregnancy 03/26/2021    Assessment/Plan:  Michelle [name] is a 31 y.o. G6P0050 at [redacted]w[redacted]d here for SOL with A1GDM  #Labor: will continue expectant management until GBS adequately treated. Planning AROM if needed. #Pain: Epidural now in place #FWB: Cat 1 #ID:  GBS positive, treating with PCN #MOF: Breast #MOC: None (same sex relationship,  IVF pregnancy with sperm donor) #Circ:  N/A #GDM: q4 CBG, last one = [address], CNM  09/29/2021, 9:32 AM

## 2021-09-30 MED ORDER — VALACYCLOVIR HCL 500 MG PO TABS
1000.0000 mg | ORAL_TABLET | Freq: Two times a day (BID) | ORAL | Status: AC
  Administered 2021-09-30 – 2021-10-01 (×2): 1000 mg via ORAL
  Filled 2021-09-30 (×2): qty 2

## 2021-09-30 NOTE — Progress Notes (Signed)
POSTPARTUM PROGRESS NOTE  Post Partum Day 1  Subjective:  Michelle [name] is a 31 y.o. [id number] s/p SVD at [redacted]w[redacted]d.  She reports she is doing well. No acute events overnight. She denies any problems with ambulating, voiding or po intake. Denies nausea or vomiting.  Pain is well controlled.  Lochia is appropriate.  Objective: Blood pressure 123/77, pulse 74, temperature 98.6 F (37 C), temperature source Oral, resp. rate 17, SpO2 98 %, unknown if currently breastfeeding.  Physical Exam:  General: alert, cooperative and no distress Chest: no respiratory distress Heart:regular rate, distal pulses intact Abdomen: soft, nontender,  Uterine Fundus: firm, appropriately tender DVT Evaluation: No calf swelling or tenderness Extremities: No edema Skin: warm, dry  Recent Labs    09/29/21 0800  HGB 12.1  HCT 37.6    Assessment/Plan: Michelle [name] is a 31 y.o. [id number] s/p SVD at [redacted]w[redacted]d   PPD#1 - Doing well Routine postpartum care CBG wnl Contraception: None Feeding: Breast Dispo: Plan for discharge 10/01/21.   LOS: 1 day   Moody Bruins, MD Resident Physician 09/30/2021, 7:54 AM

## 2021-09-30 NOTE — Lactation Note (Signed)
This note was copied from a baby's chart. Lactation Consultation Note  Patient Name: Girl Jorryn Hershberger [id number] Date: 09/30/2021 Reason for consult: Follow-up assessment;1st time breastfeeding;Early term 37-38.6wks Age:29 hours P1, ETI female infant. Birth Parent latched infant on left breast using cross cradle hold infant latched with depth, still BF after 11 minutes when LC left the room. Birth Parent did breast stimulation techniques to keep infant active feeding such as: breast compressions, BF STS, gently stroking infant's neck and shoulders. Birth Parent will continue to BF infant according to hunger cues, on demand, 8 to 12+ times within 24 hrs. STS.  Maternal Data    Feeding Mother's Current Feeding Choice: Breast Milk  LATCH Score Latch: Grasps breast easily, tongue down, lips flanged, rhythmical sucking.  Audible Swallowing: Spontaneous and intermittent  Type of Nipple: Everted at rest and after stimulation  Comfort (Breast/Nipple): Soft / non-tender  Hold (Positioning): Assistance needed to correctly position infant at breast and maintain latch.  LATCH Score: 9   Lactation Tools Discussed/Used    Interventions Interventions: Skin to skin;Assisted with latch;Position options;Support pillows;Adjust position;Breast compression;Education  Discharge    Consult Status Consult Status: Follow-up Date: 10/01/21 Follow-up type: In-patient    Danelle Earthly 09/30/2021, 4:09 PM

## 2021-09-30 NOTE — Anesthesia Postprocedure Evaluation (Signed)
Anesthesia Post Note  Patient: Michelle [name]  Procedure(s) Performed: AN AD HOC LABOR EPIDURAL     Patient location during evaluation: Mother Baby Anesthesia Type: Epidural Level of consciousness: awake and alert and oriented Pain management: satisfactory to patient Vital Signs Assessment: post-procedure vital signs reviewed and stable Respiratory status: respiratory function stable Cardiovascular status: stable Postop Assessment: no headache, no backache, epidural receding, patient able to bend at knees, no signs of nausea or vomiting, adequate PO intake and able to ambulate Anesthetic complications: no   No notable events documented.  Last Vitals:  Vitals:   09/30/21 0602 09/30/21 0955  BP: 123/77 115/77  Pulse: 74 74  Resp: 17 18  Temp: 37 C 36.6 C  SpO2: 98% 99%    Last Pain:  Vitals:   09/30/21 0955  TempSrc: Oral  PainSc: 0-No pain   Pain Goal:                   Sabree Nuon

## 2021-09-30 NOTE — Clinical Social Work Maternal (Addendum)
CLINICAL SOCIAL WORK MATERNAL/CHILD NOTE  Patient Details  Name: Michelle [name] MRN: [id number] Date of Birth: Apr 05, 1990  Date:  09/30/2021  Clinical Social Worker Initiating Note:  Kathrin Greathouse, [place] Date/Time: Initiated:  09/30/21/1044     Child's Name:  Michelle [name]   Biological Parents:  Mother, Partner (MOB: Michelle [name] Oct 15, 1990, Partner: Michelle [name] 04/04/1994)   Need for Interpreter:  None   Reason for Referral:  Current Substance Use/Substance Use During Pregnancy     Address:  [address] [place] [address]    Phone number:  [phone] (home)     Additional phone number:   Household Members/Support Persons (HM/SP):   Household Member/Support Person 1   HM/SP Name Relationship DOB or Age  HM/SP -1 Michelle [name] 04-05-1994  HM/SP -2        HM/SP -3        HM/SP -4        HM/SP -5        HM/SP -6        HM/SP -7        HM/SP -8          Natural Supports (not living in the home):      Professional Supports: None   Employment: Unemployed (Spouse employed Insurance underwriter)   Type of Work:     Education:  [place] arranged:    Museum/gallery curator Resources:  Medicaid   Other Resources:  Physicist, medical     Cultural/Religious Considerations Which May Impact Care:    Strengths:  Ability to meet basic needs  , Home prepared for child     Psychotropic Medications:         Pediatrician:       Pediatrician List:   [place]      Pediatrician Fax Number:    Risk Factors/Current Problems:  Substance Use     Cognitive State:  Able to Concentrate  , Insightful  , Alert  , Linear Thinking     Mood/Affect:  Bright  , Calm  , Comfortable     CSW Assessment: CSW received consult for "marijuana use 02/2021" CSW met with MOB to offer support and complete assessment.    CSW met with MOB at bedside and introduced CSW role.  CSW observed MOB skin to skin and bonding with the infant. MOB partner Michelle [name] at bedside and support person at bedside. CSW offered MOB privacy. MOB support person left the room and partner Michelle [name] stayed per [facility] request. MOB presented calm and welcomed CSW visit. MOB confirmed that the demographic information on file is correct and that she lives with her partner Michelle [name]. MOB reported she receives food stamps and will follow up with WIC. MOB plans to exclusively breastfeed and has a breast pump. MOB reported that she is unemployed, and her partner works for [employer]. MOB stated her partner Michelle [name] is her primary support.   CSW inquired about MOB substance use during the pregnancy. MOB disclosed that she used marijuana "every now and then" throughout the pregnancy. The last time she used was 09/[birth year]2021 on her birthday. MOB reported she used for recreation and her partner stated MOB used because of nausea as well. CSW informed MOB about the hospital drug screen policy. MOB made aware that CSW will follow the infant's UDS/CDS and make a report to  CPS, if warranted. MOB and her partner reported understanding and had no questions. CSW offered MOB substance use resources. MOB declined and denied CPS involvement.   CSW inquired about mental health. MOB reported no history of mental health. CSW assessed MOB for safety. MOB denied thoughts of harm to self and others.  CSW provided education regarding the baby blues period vs. perinatal mood disorders, discussed treatment and gave resources for mental health follow up if concerns arise.  CSW recommended MOB complete a self-evaluation during the postpartum time period using the New Mom Checklist from Postpartum Progress and encouraged MOB to contact a medical professional if symptoms are noted at any time.     MOB reported she has all items for the infant including a bassinet where the infant will sleep. CSW provided review of Sudden Infant Death Syndrome (SIDS)  precautions.   MOB is deciding on a pediatrician. CSW provided MOB with a list of [facility] resources for food assistance, child care etc. MOB was very Patent attorney.   Infant's UDS pending. CSW Will Continue to Monitor Urine/ Umbilical Cord Tissue Drug Screen Results and Make Report if Warranted   CSW identifies no further need for intervention and no barriers to discharge at this time.     CSW Plan/Description:  Sudden Infant Death Syndrome (SIDS) Education, CSW Will Continue to Monitor Umbilical Cord Tissue Drug Screen Results and Make Report if Warranted, [place], Perinatal Mood and Anxiety Disorder (PMADs) Education, No Further Intervention Required/No Barriers to Discharge    Lia Hopping, LCSW 09/30/2021, 10:52 AM

## 2021-10-01 ENCOUNTER — Inpatient Hospital Stay (HOSPITAL_COMMUNITY): Admission: AD | Admit: 2021-10-01 | Payer: Medicaid Other | Source: Home / Self Care | Admitting: Family Medicine

## 2021-10-01 ENCOUNTER — Inpatient Hospital Stay (HOSPITAL_COMMUNITY): Payer: Medicaid Other

## 2021-10-01 MED ORDER — BENZOCAINE-MENTHOL 20-0.5 % EX AERO: 1.0000 | INHALATION_SPRAY | CUTANEOUS | 0 refills | Status: DC | PRN

## 2021-10-01 MED ORDER — IBUPROFEN 600 MG PO TABS: 600.0000 mg | ORAL_TABLET | Freq: Four times a day (QID) | ORAL | 0 refills | Status: DC

## 2021-10-01 MED ORDER — ACETAMINOPHEN 325 MG PO TABS: 650.0000 mg | ORAL_TABLET | Freq: Four times a day (QID) | ORAL | 0 refills | Status: DC | PRN

## 2021-10-01 NOTE — Lactation Note (Signed)
This note was copied from a baby's chart. Lactation Consultation Note  Patient Name: Michelle [name] [id number] Date: 10/01/2021 Reason for consult: Follow-up assessment Age:29 hours  P1, Baby sleeping after 60 min feeding. Reviewed engorgement care and monitoring voids/stools. Feed on demand with cues.  Goal 8-12+ times per day after first 24 hrs.  Place baby STS if not cueing.   Feeding Mother's Current Feeding Choice: Breast Milk  Interventions Interventions: Education  Discharge Discharge Education: Engorgement and breast care;Warning signs for feeding baby  Consult Status Consult Status: Complete Date: 10/01/21    Dahlia Byes [facility] 10/01/2021, 1:48 PM

## 2021-10-02 ENCOUNTER — Other Ambulatory Visit: Payer: Self-pay | Admitting: Family Medicine

## 2021-10-02 ENCOUNTER — Encounter: Payer: Medicaid Other | Admitting: Family Medicine

## 2021-10-02 MED ORDER — ACETAMINOPHEN 325 MG PO TABS
650.0000 mg | ORAL_TABLET | Freq: Four times a day (QID) | ORAL | 0 refills | Status: AC | PRN
Start: 2021-10-02 — End: ?

## 2021-10-02 MED ORDER — BENZOCAINE-MENTHOL 20-0.5 % EX AERO: 1.0000 | INHALATION_SPRAY | CUTANEOUS | 0 refills | Status: AC | PRN

## 2021-10-02 MED ORDER — VALACYCLOVIR HCL 500 MG PO TABS: 500.0000 mg | ORAL_TABLET | Freq: Two times a day (BID) | ORAL | 2 refills | Status: AC

## 2021-10-09 ENCOUNTER — Encounter: Payer: Medicaid Other | Admitting: Family Medicine

## 2021-10-09 ENCOUNTER — Other Ambulatory Visit: Payer: Medicaid Other

## 2021-10-10 ENCOUNTER — Telehealth (HOSPITAL_COMMUNITY): Payer: Self-pay | Admitting: *Deleted

## 2021-10-10 NOTE — Telephone Encounter (Signed)
Call cannot be completed.  Duffy Rhody, RN 10-10-2021 at 11:00am

## 2021-10-14 ENCOUNTER — Other Ambulatory Visit: Payer: Self-pay | Admitting: Family Medicine

## 2021-10-14 ENCOUNTER — Encounter: Payer: Self-pay | Admitting: Family Medicine

## 2021-10-14 DIAGNOSIS — G8929 Other chronic pain: Secondary | ICD-10-CM

## 2021-10-14 MED ORDER — IBUPROFEN 600 MG PO TABS: 600.0000 mg | ORAL_TABLET | Freq: Four times a day (QID) | ORAL | 6 refills | Status: DC

## 2021-10-28 NOTE — Progress Notes (Signed)
[facility] Part[facility] Visit Note  Michelle [name] is a 31 y.o. [phone] female who presents for a postpartum visit. She is 4 weeks postpartum following a normal spontaneous vaginal delivery.  I have fully reviewed the prenatal and intrapartum course. The delivery was at 38.5 gestational weeks.  Anesthesia: epidural. Postpartum course has been uneventful. Baby is doing well. Baby is feeding by breast. Bleeding staining only. Bowel function is normal. Bladder function is normal. Patient is not sexually active. Contraception method is none. Postpartum depression screening: negative.   The pregnancy intention screening data noted above was reviewed. Potential methods of contraception were discussed. The patient elected to proceed with No data recorded.   Edinburgh Postnatal Depression Scale - 10/29/21 0923       Edinburgh Postnatal Depression Scale:  In the Past 7 Days   I have been able to laugh and see the funny side of things. 0    I have looked forward with enjoyment to things. 0    I have blamed myself unnecessarily when things went wrong. 0    I have been anxious or worried for no good reason. 0    I have felt scared or panicky for no good reason. 0    Things have been getting on top of me. 0    I have been so unhappy that I have had difficulty sleeping. 0    I have felt sad or miserable. 0    I have been so unhappy that I have been crying. 0    The thought of harming myself has occurred to me. 0    Edinburgh Postnatal Depression Scale Total 0             There are no preventive care reminders to display for this patient.  The following portions of the patient's history were reviewed and updated as appropriate: allergies, current medications, past family history, past medical history, past social history, past surgical history, and problem list.  Review of Systems Pertinent items noted in HPI and remainder of comprehensive ROS otherwise negative.  Objective:  BP 128/85   Pulse 75    Wt 145 lb 3.2 oz (65.9 kg)   LMP  (LMP Unknown)   Breastfeeding Yes   BMI 25.72 kg/m   BP Readings from Last 3 Encounters:  10/29/21 128/85  10/01/21 113/77  09/25/21 116/77      General:  alert, cooperative, and appears stated age   Breasts:  not indicated  Lungs: Comfortalbe on room air  Wound N/a  GU exam:  not indicated         Assessment:    There are no diagnoses linked to this encounter.  Normal postpartum exam.   Plan:   Essential components of care per ACOG recommendations:  1.  Mood and well being: Patient with negative depression screening today. Reviewed local resources for support.  - Patient tobacco use? No.   - hx of drug use? No.    2. Infant care and feeding:  -Patient currently breastmilk feeding? Yes. Reviewed importance of draining breast regularly to support lactation.  -Social determinants of health (SDOH) reviewed in EPIC. No concerns  3. Sexuality, contraception and birth spacing - Patient does not want a pregnancy in the next year.  Desired family size is 1 children.  - Reviewed reproductive life planning. Reviewed contraceptive methods based on pt preferences and effectiveness.  Patient desired nothing today.   - Discussed birth spacing of 18 months  4. Sleep and fatigue -  Encouraged family/partner/community support of 4 hrs of uninterrupted sleep to help with mood and fatigue  5. Physical Recovery  - Discussed patients delivery and complications. She describes her labor as good. - Patient had a Vaginal, no problems at delivery. Patient had a 1st degree laceration. Perineal healing reviewed. Patient expressed understanding - Patient has urinary incontinence? No. - Patient is safe to resume physical and sexual activity  6.  Health Maintenance - HM due items addressed No - up todate - Last pap smear  Diagnosis  Date Value Ref Range Status  04/02/2021   Final   - Negative for intraepithelial lesion or malignancy (NILM)   Pap smear  not done at today's visit.  -Breast Cancer screening indicated? No.   7. Chronic Disease/Pregnancy Condition follow up: Hypertension Initial BP 135/92, repeat 128/85. No diagnosis of gHTN or other PIH. Recheck along with daughter's 2 month [place] in a few weeks.  - PCP follow up  Clarnce Flock, MD/MPH Attending Family Medicine Physician, [facility] for [facility], [place]

## 2021-10-29 ENCOUNTER — Ambulatory Visit (INDEPENDENT_AMBULATORY_CARE_PROVIDER_SITE_OTHER): Payer: Medicaid Other | Admitting: Family Medicine

## 2021-10-29 ENCOUNTER — Other Ambulatory Visit: Payer: Self-pay

## 2021-10-29 ENCOUNTER — Encounter: Payer: Self-pay | Admitting: Family Medicine

## 2021-10-29 DIAGNOSIS — M545 Low back pain, unspecified: Secondary | ICD-10-CM | POA: Diagnosis not present

## 2021-10-29 DIAGNOSIS — G8929 Other chronic pain: Secondary | ICD-10-CM | POA: Diagnosis not present

## 2021-10-29 DIAGNOSIS — R030 Elevated blood-pressure reading, without diagnosis of hypertension: Secondary | ICD-10-CM | POA: Diagnosis not present

## 2021-10-29 MED ORDER — IBUPROFEN 600 MG PO TABS: 600.0000 mg | ORAL_TABLET | Freq: Four times a day (QID) | ORAL | 6 refills | Status: DC

## 2021-11-29 ENCOUNTER — Other Ambulatory Visit: Payer: Self-pay | Admitting: Family Medicine

## 2021-11-29 DIAGNOSIS — J4520 Mild intermittent asthma, uncomplicated: Secondary | ICD-10-CM

## 2021-11-29 DIAGNOSIS — T7840XA Allergy, unspecified, initial encounter: Secondary | ICD-10-CM

## 2021-11-29 MED ORDER — FLUTICASONE PROPIONATE HFA 110 MCG/ACT IN AERO: 1.0000 | INHALATION_SPRAY | Freq: Every day | RESPIRATORY_TRACT | 12 refills | Status: DC

## 2021-11-29 MED ORDER — ALBUTEROL SULFATE HFA 108 (90 BASE) MCG/ACT IN AERS: 1.0000 | INHALATION_SPRAY | Freq: Four times a day (QID) | RESPIRATORY_TRACT | 1 refills | Status: DC | PRN

## 2021-11-29 MED ORDER — FLUTICASONE PROPIONATE 50 MCG/ACT NA SUSP: 2.0000 | Freq: Every day | NASAL | 11 refills | Status: DC

## 2021-11-29 NOTE — Progress Notes (Signed)
Reports bad allergies and daily use of albuterol inhaler at her daughter's well child check.  Refill sent for flovent, encouraged to use daily and rinse mouth afterwards. Also sent refill for albuterol.  Also trial flonase daily.

## 2021-12-25 ENCOUNTER — Other Ambulatory Visit: Payer: Self-pay | Admitting: Family Medicine

## 2022-01-26 ENCOUNTER — Other Ambulatory Visit: Payer: Self-pay

## 2022-01-26 ENCOUNTER — Emergency Department (HOSPITAL_COMMUNITY)
Admission: EM | Admit: 2022-01-26 | Discharge: 2022-01-27 | Discharge status: Against Medical Advice | Payer: Medicaid Other | Attending: Emergency Medicine | Admitting: Emergency Medicine

## 2022-01-26 ENCOUNTER — Other Ambulatory Visit: Payer: Self-pay | Admitting: Family Medicine

## 2022-01-26 ENCOUNTER — Ambulatory Visit (HOSPITAL_COMMUNITY)
Admission: EM | Admit: 2022-01-26 | Discharge: 2022-01-26 | Disposition: A | Discharge status: Home / Self Care | Payer: Medicaid Other | Attending: Emergency Medicine | Admitting: Emergency Medicine

## 2022-01-26 DIAGNOSIS — J45909 Unspecified asthma, uncomplicated: Secondary | ICD-10-CM | POA: Insufficient documentation

## 2022-01-26 DIAGNOSIS — Z1152 Encounter for screening for COVID-19: Secondary | ICD-10-CM | POA: Insufficient documentation

## 2022-01-26 DIAGNOSIS — J4521 Mild intermittent asthma with (acute) exacerbation: Secondary | ICD-10-CM | POA: Diagnosis not present

## 2022-01-26 DIAGNOSIS — Z5321 Procedure and treatment not carried out due to patient leaving prior to being seen by health care provider: Secondary | ICD-10-CM | POA: Diagnosis not present

## 2022-01-26 DIAGNOSIS — Z7951 Long term (current) use of inhaled steroids: Secondary | ICD-10-CM | POA: Insufficient documentation

## 2022-01-26 DIAGNOSIS — R0602 Shortness of breath: Secondary | ICD-10-CM | POA: Diagnosis not present

## 2022-01-26 DIAGNOSIS — T7840XA Allergy, unspecified, initial encounter: Secondary | ICD-10-CM

## 2022-01-26 MED ORDER — IPRATROPIUM BROMIDE 0.02 % IN SOLN
0.5000 mg | Freq: Once | RESPIRATORY_TRACT | Status: AC
  Administered 2022-01-26: 0.5 mg via RESPIRATORY_TRACT
  Filled 2022-01-26: qty 2.5

## 2022-01-26 MED ORDER — ALBUTEROL SULFATE (2.5 MG/3ML) 0.083% IN NEBU: 20.0000 mg/h | INHALATION_SOLUTION | Freq: Once | RESPIRATORY_TRACT | Status: DC

## 2022-01-26 MED ORDER — METHYLPREDNISOLONE SODIUM SUCC 125 MG IJ SOLR: 125.0000 mg | Freq: Once | INTRAMUSCULAR | Status: DC

## 2022-01-26 NOTE — ED Notes (Signed)
Pt called for registration with no answer.

## 2022-01-26 NOTE — ED Triage Notes (Signed)
Patient reports asthma attack onset yesterday unrelieved by her inhaler.

## 2022-01-26 NOTE — ED Notes (Signed)
Called pt no answer °

## 2022-01-26 NOTE — ED Provider Triage Note (Signed)
Emergency Medicine Provider Triage Evaluation Note  Michelle [name] , a 31 y.o. female  was evaluated in triage.  Pt complains of  of asthma attack since last night. Has tried nebulizers and albuterol inhalers without much relief. No recent cough or cold symptoms. No fever.  Review of Systems  Positive:  Negative:   Physical Exam  There were no vitals taken for this visit. Gen:   Awake, no distress   Resp:  Normal effort  MSK:   Moves extremities without difficulty  Other:  Tripoding. Wheezing.   Medical Decision Making  Medically screening exam initiated at 7:12 PM.  Appropriate orders placed.  Michelle [name] was informed that the remainder of the evaluation will be completed by another provider, this initial triage assessment does not replace that evaluation, and the importance of remaining in the ED until their evaluation is complete.  Nursing aware that patient needs interventions NOW.    Michelle [name], [place] 01/26/22 8676

## 2022-01-26 NOTE — ED Notes (Signed)
Pt called for vitals x2 with no answer. 

## 2022-01-27 ENCOUNTER — Other Ambulatory Visit: Payer: Self-pay

## 2022-01-27 ENCOUNTER — Emergency Department (HOSPITAL_COMMUNITY)
Admission: EM | Admit: 2022-01-27 | Discharge: 2022-01-27 | Disposition: A | Discharge status: Home / Self Care | Payer: Medicaid Other | Source: Home / Self Care | Attending: Emergency Medicine | Admitting: Emergency Medicine

## 2022-01-27 ENCOUNTER — Emergency Department (HOSPITAL_COMMUNITY): Payer: Medicaid Other

## 2022-01-27 DIAGNOSIS — Z7951 Long term (current) use of inhaled steroids: Secondary | ICD-10-CM | POA: Insufficient documentation

## 2022-01-27 DIAGNOSIS — Z1152 Encounter for screening for COVID-19: Secondary | ICD-10-CM | POA: Insufficient documentation

## 2022-01-27 DIAGNOSIS — R42 Dizziness and giddiness: Secondary | ICD-10-CM | POA: Diagnosis not present

## 2022-01-27 DIAGNOSIS — J4521 Mild intermittent asthma with (acute) exacerbation: Secondary | ICD-10-CM | POA: Diagnosis not present

## 2022-01-27 DIAGNOSIS — R069 Unspecified abnormalities of breathing: Secondary | ICD-10-CM | POA: Diagnosis not present

## 2022-01-27 DIAGNOSIS — R062 Wheezing: Secondary | ICD-10-CM | POA: Diagnosis not present

## 2022-01-27 DIAGNOSIS — R0789 Other chest pain: Secondary | ICD-10-CM | POA: Diagnosis not present

## 2022-01-27 DIAGNOSIS — M549 Dorsalgia, unspecified: Secondary | ICD-10-CM | POA: Diagnosis not present

## 2022-01-27 DIAGNOSIS — R0602 Shortness of breath: Secondary | ICD-10-CM | POA: Diagnosis not present

## 2022-01-27 LAB — RESP PANEL BY RT-PCR (RSV, FLU A&B, COVID)  RVPGX2
Influenza A by PCR: NEGATIVE
Influenza B by PCR: NEGATIVE
Resp Syncytial Virus by PCR: NEGATIVE
SARS Coronavirus 2 by RT PCR: NEGATIVE

## 2022-01-27 MED ORDER — ALBUTEROL SULFATE (2.5 MG/3ML) 0.083% IN NEBU
2.5000 mg | INHALATION_SOLUTION | Freq: Once | RESPIRATORY_TRACT | Status: AC
  Administered 2022-01-27: 2.5 mg via RESPIRATORY_TRACT
  Filled 2022-01-27: qty 3

## 2022-01-27 MED ORDER — ALBUTEROL SULFATE HFA 108 (90 BASE) MCG/ACT IN AERS: 2.0000 | INHALATION_SPRAY | RESPIRATORY_TRACT | 0 refills | Status: DC | PRN

## 2022-01-27 MED ORDER — PREDNISONE 10 MG PO TABS: 40.0000 mg | ORAL_TABLET | Freq: Every day | ORAL | 0 refills | Status: AC

## 2022-01-27 MED ORDER — IPRATROPIUM-ALBUTEROL 0.5-2.5 (3) MG/3ML IN SOLN
3.0000 mL | Freq: Once | RESPIRATORY_TRACT | Status: AC
Start: 2022-01-27 — End: 2022-01-27
  Administered 2022-01-27: 3 mL via RESPIRATORY_TRACT
  Filled 2022-01-27: qty 3

## 2022-01-27 MED ORDER — ALBUTEROL SULFATE (2.5 MG/3ML) 0.083% IN NEBU: 2.5000 mg | INHALATION_SOLUTION | Freq: Four times a day (QID) | RESPIRATORY_TRACT | 12 refills | Status: DC | PRN

## 2022-01-27 NOTE — ED Triage Notes (Signed)
Patient arrived with EMS from home reports asthma with wheezing onset yesterday , she received Solumedrol IV 125 mg ; Albuterol 10 mg/Atrovent 1 mg nebulizer and Magnesium 2 grams IV  prior to arrival .

## 2022-01-27 NOTE — ED Provider Notes (Signed)
[facility] EMERGENCY DEPARTMENT Provider Note   CSN: [id number] Arrival date & time: 01/27/22  4259     History  Chief Complaint  Patient presents with   Asthma    Michelle [name] is a 32 y.o. female.  Patient is a 32 year old female with a past medical history of asthma presenting to the emergency department with shortness of breath.  Patient states that she has been an asthma exacerbation for the past 1 to 2 days.  She states that she is unsure what triggered it and that she has not had an asthma exacerbation in several years.  She states that she was using her inhaler at home and has now run out and was not having much relief.  She denies any recent fevers or chills.  She states that she has had a dry cough with some chest tightness.  She denies any congestion, runny nose or sore throat, nausea, vomiting or diarrhea.  States that she has not been hospitalized since childhood for her asthma and has never required an ICU admission.  The history is provided by the patient.       Home Medications Prior to Admission medications   Medication Sig Start Date End Date Taking? Authorizing Provider  albuterol (PROVENTIL) (2.5 MG/3ML) 0.083% nebulizer solution Take 3 mLs (2.5 mg total) by nebulization every 6 (six) hours as needed for wheezing or shortness of breath. 01/27/22  Yes Theresia Lo, Benetta Spar K, DO  albuterol (VENTOLIN HFA) 108 (90 Base) MCG/ACT inhaler Inhale 2 puffs into the lungs every 4 (four) hours as needed for wheezing or shortness of breath. 01/27/22  Yes Theresia Lo, [place] K, DO  predniSONE (DELTASONE) 10 MG tablet Take 4 tablets (40 mg total) by mouth daily for 4 days. 01/27/22 01/31/22 Yes Elayne Snare K, DO  acetaminophen (TYLENOL) 325 MG tablet Take 2 tablets (650 mg total) by mouth every 6 (six) hours as needed. 10/02/21   Venora Maples, MD  albuterol (PROAIR HFA) 108 (90 Base) MCG/ACT inhaler Inhale 1-2 puffs into the lungs every 6 (six) hours as needed  for wheezing or shortness of breath. 11/29/21   Venora Maples, MD  fluticasone (FLONASE) 50 MCG/ACT nasal spray Place 2 sprays into both nostrils daily. 11/29/21   Venora Maples, MD  fluticasone (FLOVENT HFA) 110 MCG/ACT inhaler Inhale 1 puff into the lungs daily. 11/29/21   Venora Maples, MD  ibuprofen (ADVIL) 600 MG tablet Take 1 tablet (600 mg total) by mouth every 6 (six) hours. 10/29/21   Venora Maples, MD  Prenatal Vit-Fe Fumarate-FA (PRENATAL VITAMIN PO) Take 1 tablet by mouth daily.    [provider]  valACYclovir (VALTREX) 500 MG tablet Take 1 tablet (500 mg total) by mouth 2 (two) times daily. 10/02/21   Venora Maples, MD      Allergies    Patient has no known allergies.    Review of Systems   Review of Systems  Physical Exam Updated Vital Signs BP (!) 142/101 (BP Location: Right Arm)   Pulse (!) 105   Temp 97.8 F (36.6 C) (Oral)   Resp 18   SpO2 100%  Physical Exam Vitals and nursing note reviewed.  Constitutional:      General: She is not in acute distress.    Appearance: Normal appearance.  HENT:     Head: Normocephalic and atraumatic.     Nose: Nose normal.     Mouth/Throat:     Mouth: Mucous membranes are moist.  Pharynx: Oropharynx is clear.  Eyes:     Extraocular Movements: Extraocular movements intact.     Conjunctiva/sclera: Conjunctivae normal.  Cardiovascular:     Rate and Rhythm: Normal rate and regular rhythm.     Heart sounds: Normal heart sounds.  Pulmonary:     Effort: Pulmonary effort is normal. No respiratory distress.     Breath sounds: Wheezing (Diffuse end expiratory) present.  Abdominal:     General: Abdomen is flat.     Palpations: Abdomen is soft.     Tenderness: There is no abdominal tenderness.  Musculoskeletal:        General: Normal range of motion.     Cervical back: Normal range of motion and neck supple.  Skin:    General: Skin is warm and dry.  Neurological:     General: No focal deficit  present.     Mental Status: She is alert and oriented to person, place, and time.  Psychiatric:        Mood and Affect: Mood normal.        Behavior: Behavior normal.     ED Results / Procedures / Treatments   Labs (all labs ordered are listed, but only abnormal results are displayed) Labs Reviewed  RESP PANEL BY RT-PCR (RSV, FLU A&B, COVID)  [id number]    EKG None  Radiology DG Chest Portable 1 View  Result Date: 01/27/2022 CLINICAL DATA:  Shortness of breath. EXAM: PORTABLE CHEST 1 VIEW COMPARISON:  Jun 18, 2015 FINDINGS: The heart size and mediastinal contours are within normal limits. Both lungs are clear. The visualized skeletal structures are unremarkable. IMPRESSION: No active disease. Electronically Signed   By: Virgina Norfolk M.D.   On: 01/27/2022 03:04    Procedures Procedures    Medications Ordered in ED Medications  ipratropium-albuterol (DUONEB) 0.5-2.5 (3) MG/3ML nebulizer solution 3 mL (3 mLs Nebulization Given 01/27/22 0930)  albuterol (PROVENTIL) (2.5 MG/3ML) 0.083% nebulizer solution 2.5 mg (2.5 mg Nebulization Given 01/27/22 0930)    ED Course/ Medical Decision Making/ A&P Clinical Course as of 01/27/22 1047  Mon Jan 27, 2022  1045 Upon reassessment, the patient reports significant improvement of her symptoms.  She now has clear breath sounds bilaterally with no wheezing.  She is stable for discharge home with primary care follow-up.  She given steroid burst and refill of her albuterol and was given strict return precautions. [VK]    Clinical Course User Index [VK] Kemper Durie, DO                           Medical Decision Making This patient presents to the ED with chief complaint(s) of shortness of breath with pertinent past medical history of asthma which further complicates the presenting complaint. The complaint involves an extensive differential diagnosis and also carries with it a high risk of complications and morbidity.    The differential  diagnosis includes patient has wheezing on exam consistent with asthma exacerbation, she has no fever no focal lung sounds making pneumonia unlikely, she has had no recent viral syndrome making viral infection less likely, she has equal bilateral breath sounds making pneumothorax unlikely  Additional history obtained: Additional history obtained from N/A Records reviewed previous ED records  ED Course and Reassessment: Patient arrived via EMS and received Solu-Medrol, mag and DuoNeb and route.  She reported some improvement of her breathing but is not at her baseline.  She continues to have an expiratory wheeze.  She will be given additional DuoNeb and albuterol neb and will be reassessed.  Chest x-ray was performed that shows no acute disease.  Patient is agreeable to viral swab.  Independent labs interpretation:  The following labs were independently interpreted: Viral panel pending  Independent visualization of imaging: - I independently visualized the following imaging with scope of interpretation limited to determining acute life threatening conditions related to emergency care: Chest x-ray, which revealed no acute disease  Consultation: - Consulted or discussed management/test interpretation w/ external professional: N/a  Consideration for admission or further workup: Patient has no emergent conditions requiring admission or further work-up at this time and is stable for discharge home with primary care follow-up  Social Determinants of health: N/A    Risk Prescription drug management.          Final Clinical Impression(s) / ED Diagnoses Final diagnoses:  Mild intermittent asthma with exacerbation    Rx / DC Orders ED Discharge Orders          Ordered    albuterol (VENTOLIN HFA) 108 (90 Base) MCG/ACT inhaler  Every 4 hours PRN        01/27/22 1046    predniSONE (DELTASONE) 10 MG tablet  Daily        01/27/22 1046    For home use only DME Nebulizer machine         01/27/22 1046    albuterol (PROVENTIL) (2.5 MG/3ML) 0.083% nebulizer solution  Every 6 hours PRN        01/27/22 1046              [place], [place] K, DO 01/27/22 1047

## 2022-01-27 NOTE — ED Provider Triage Note (Addendum)
Emergency Medicine Provider Triage Evaluation Note  Michelle [name] , a 32 y.o. female  was evaluated in triage.  Pt complains of asthma exacerbation.  She was seen earlier in the emergency department, treated but left without being seen.  He presents by EMS.  She reports chest tightness.  No recent fevers or cough.  Per EMS report, they have administered: 10mg  albuterol 1mg  atrovent 2g mg Mg++ 125mg  solumedrol  91% on RA on arrival, 100% on O2  Review of Systems  Positive: Shortness of breath, wheezing Negative: Fever  Physical Exam  There were no vitals taken for this visit. Gen:   Awake Resp:  Increased work of breathing, on nebulizer treatment, wheezing throughout all fields with decreased air movement MSK:   Moves extremities without difficulty  Other:  Currently sitting in hallway in triage  Medical Decision Making  Medically screening exam initiated at 2:25 AM.  Appropriate orders placed.  Michelle [name] was informed that the remainder of the evaluation will be completed by another provider, this initial triage assessment does not replace that evaluation, and the importance of remaining in the ED until their evaluation is complete.  Michelle Cater, PA-C 01/27/22 0232   3:15 AM Treatment completed, pt looks more comfortable now.     Michelle Cater, PA-C 01/27/22 [phone]

## 2022-01-27 NOTE — Discharge Instructions (Signed)
In the emergency department for asthma exacerbation.  Your chest x-ray showed no signs of pneumonia.  We gave you steroids and breathing treatments in the emergency department and your wheezing improved.  I have given you a course of steroids you should take over the next 4 days and you should take your albuterol every 4 hours for the next 24 hours and then you can return to as needed.  You should follow-up with your primary doctor in the next few days to have your symptoms rechecked.  You should return to the emergency department for significantly worsening shortness of breath, severe chest pain, or if you have any other new or concerning symptoms.

## 2022-02-11 ENCOUNTER — Telehealth: Payer: Medicaid Other | Admitting: Family Medicine

## 2022-02-11 DIAGNOSIS — J45901 Unspecified asthma with (acute) exacerbation: Secondary | ICD-10-CM

## 2022-02-11 NOTE — Progress Notes (Signed)
Because several symptoms that are worrisome with uncontrolled asthma, I feel your condition warrants further evaluation and I recommend that you be seen in a face to face visit.   NOTE: There will be NO CHARGE for this eVisit

## 2022-06-09 ENCOUNTER — Encounter: Payer: Self-pay | Admitting: Family Medicine

## 2022-06-09 DIAGNOSIS — Z8781 Personal history of (healed) traumatic fracture: Secondary | ICD-10-CM

## 2022-06-30 ENCOUNTER — Encounter: Payer: Self-pay | Admitting: Family Medicine

## 2022-07-01 ENCOUNTER — Other Ambulatory Visit: Payer: Self-pay | Admitting: Family Medicine

## 2022-07-01 DIAGNOSIS — M545 Low back pain, unspecified: Secondary | ICD-10-CM

## 2022-07-01 MED ORDER — IBUPROFEN 600 MG PO TABS: 600.0000 mg | ORAL_TABLET | Freq: Four times a day (QID) | ORAL | 6 refills | Status: AC

## 2022-07-07 DIAGNOSIS — M79604 Pain in right leg: Secondary | ICD-10-CM | POA: Diagnosis not present

## 2022-09-22 ENCOUNTER — Encounter: Payer: Self-pay | Admitting: Family Medicine

## 2022-10-28 ENCOUNTER — Encounter: Payer: Self-pay | Admitting: Family Medicine

## 2022-10-28 DIAGNOSIS — J4520 Mild intermittent asthma, uncomplicated: Secondary | ICD-10-CM

## 2022-10-30 ENCOUNTER — Encounter: Payer: Self-pay | Admitting: Obstetrics and Gynecology

## 2022-11-03 MED ORDER — FLUTICASONE PROPIONATE HFA 110 MCG/ACT IN AERO: 1.0000 | INHALATION_SPRAY | Freq: Every day | RESPIRATORY_TRACT | 12 refills | Status: DC

## 2022-11-03 MED ORDER — ALBUTEROL SULFATE HFA 108 (90 BASE) MCG/ACT IN AERS: 1.0000 | INHALATION_SPRAY | Freq: Four times a day (QID) | RESPIRATORY_TRACT | 6 refills | Status: DC | PRN

## 2023-01-06 ENCOUNTER — Other Ambulatory Visit: Payer: Self-pay

## 2023-01-06 DIAGNOSIS — J4520 Mild intermittent asthma, uncomplicated: Secondary | ICD-10-CM

## 2023-01-06 MED ORDER — ALBUTEROL SULFATE HFA 108 (90 BASE) MCG/ACT IN AERS: 1.0000 | INHALATION_SPRAY | Freq: Four times a day (QID) | RESPIRATORY_TRACT | 6 refills | Status: DC | PRN

## 2023-01-06 MED ORDER — ALBUTEROL SULFATE HFA 108 (90 BASE) MCG/ACT IN AERS: 2.0000 | INHALATION_SPRAY | RESPIRATORY_TRACT | 2 refills | Status: DC | PRN

## 2023-01-06 MED ORDER — ALBUTEROL SULFATE (2.5 MG/3ML) 0.083% IN NEBU: 2.5000 mg | INHALATION_SOLUTION | Freq: Four times a day (QID) | RESPIRATORY_TRACT | 12 refills | Status: DC | PRN

## 2023-01-06 NOTE — Progress Notes (Signed)
Refills ordered for mom. Has asthma.   Judeth Cornfield, RNC

## 2023-01-11 IMAGING — US US OB COMP LESS 14 WK
1 series · 15 of 28 positions shown · non-contrast
Comparison: None.

CLINICAL DATA: Dating of pregnancy

EXAM:
OBSTETRIC <14 WK ULTRASOUND
TECHNIQUE: Transabdominal ultrasound was performed for evaluation of the
gestation as well as the maternal uterus and adnexal regions.

[Series 1: us ob comp less 14 wk · 15 of 29 slices shown]
[im 1/29]
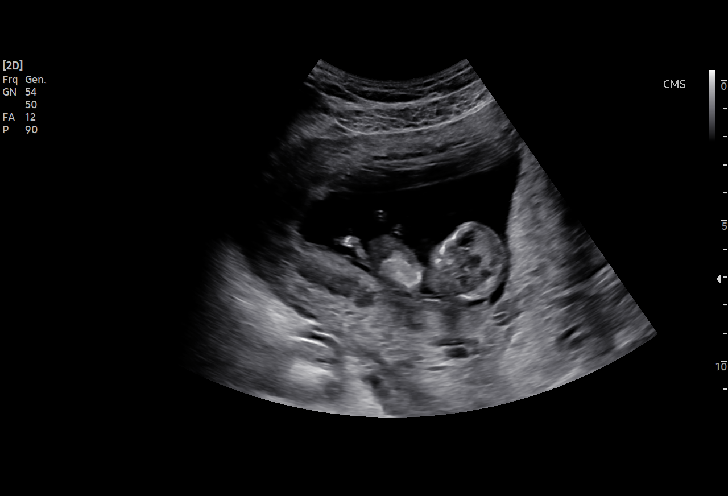
[im 3/29]
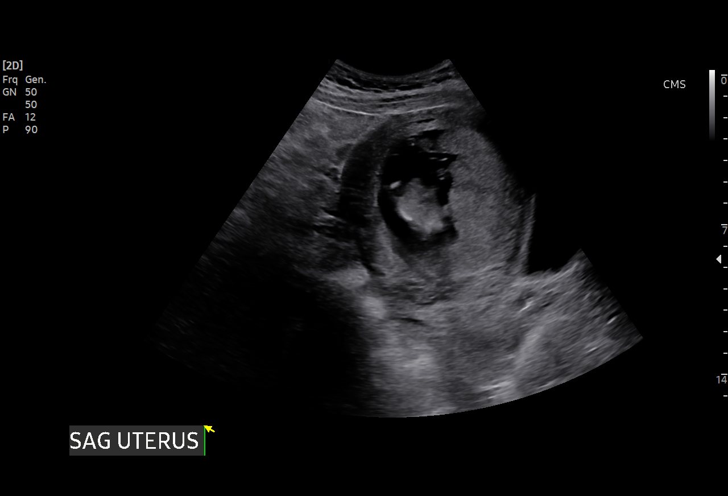
[im 5/29]
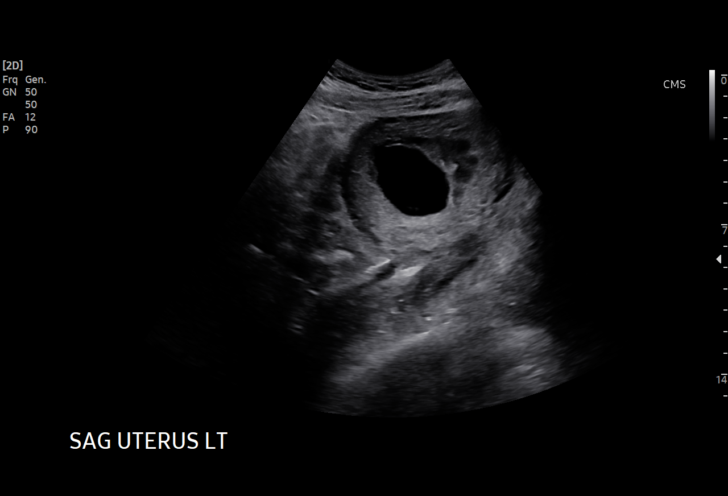
[im 7/29]
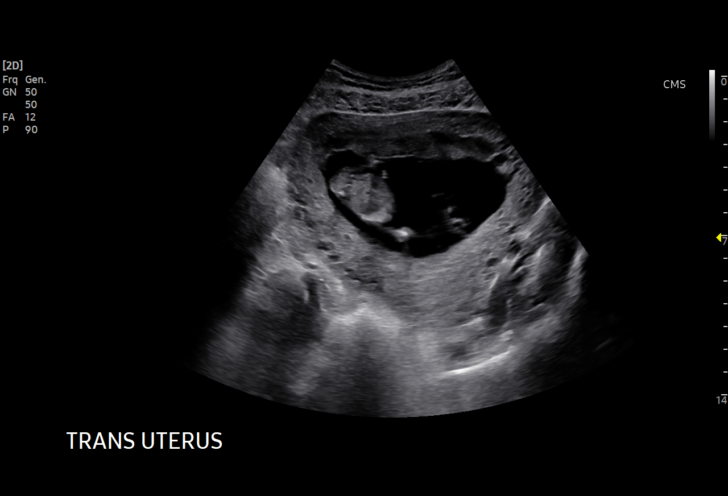
[im 9/29]
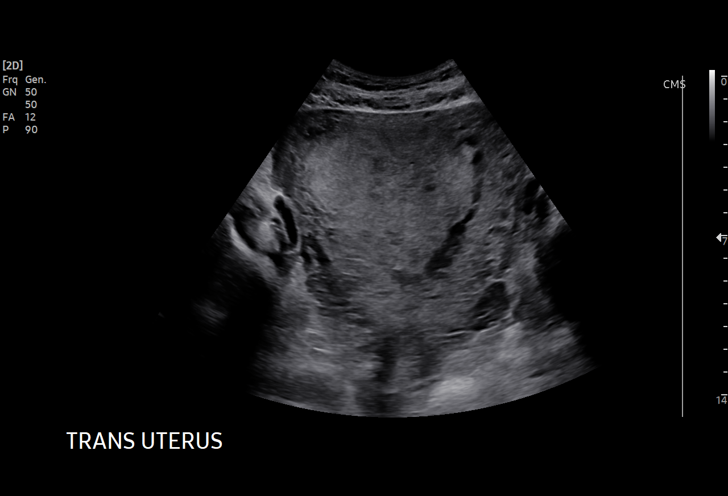
[im 11/29]
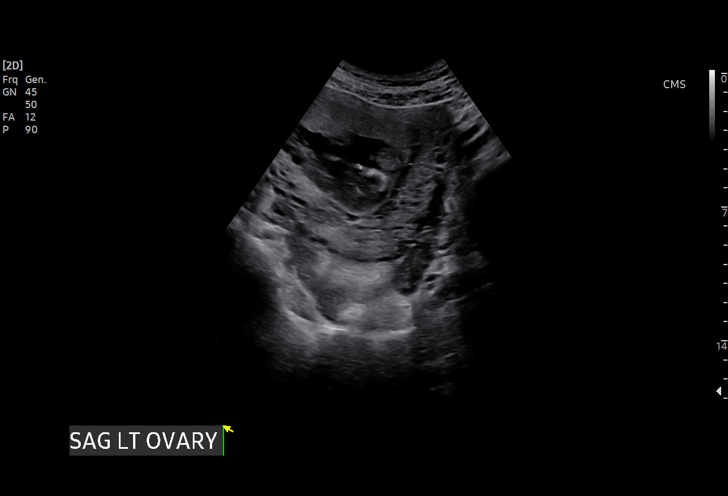
[im 13/29]
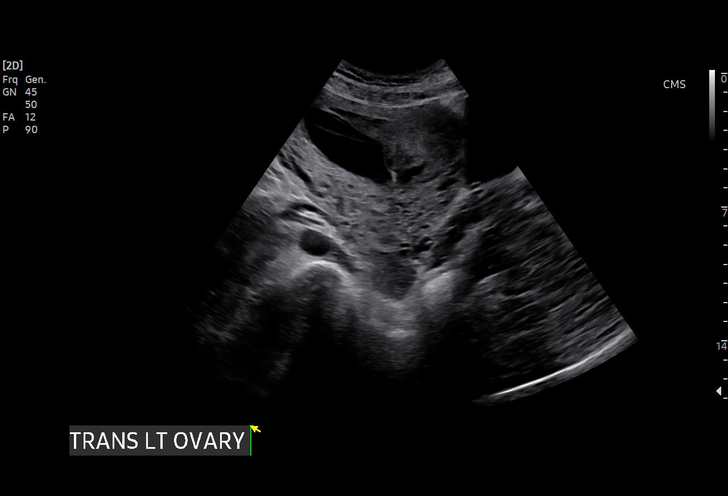
[im 15/29]
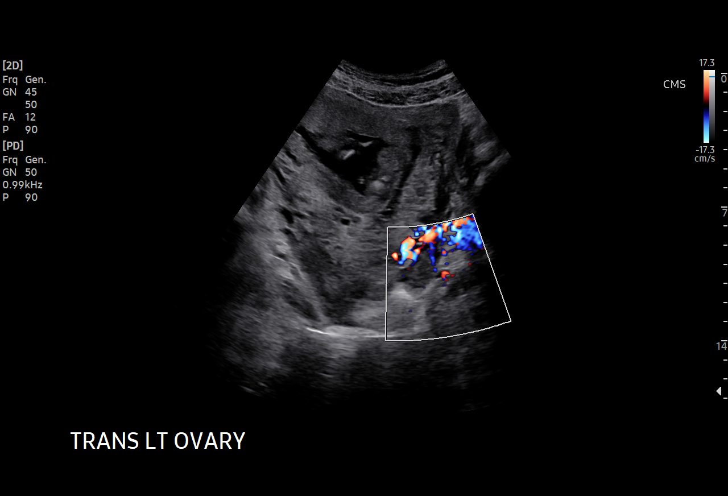
[im 16/29]
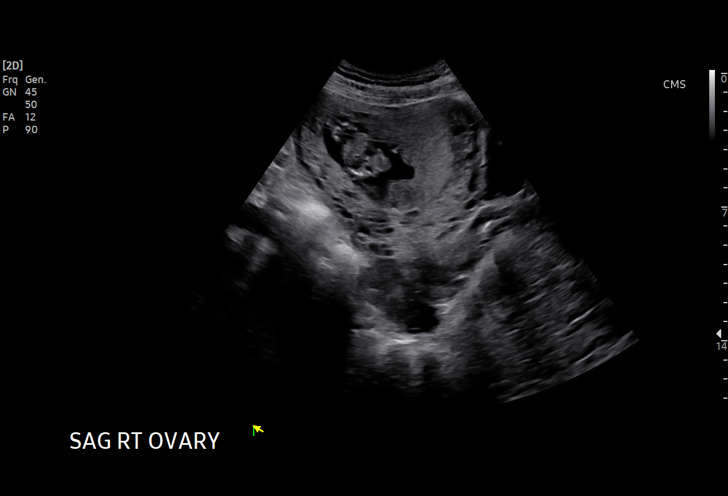
[im 18/29]
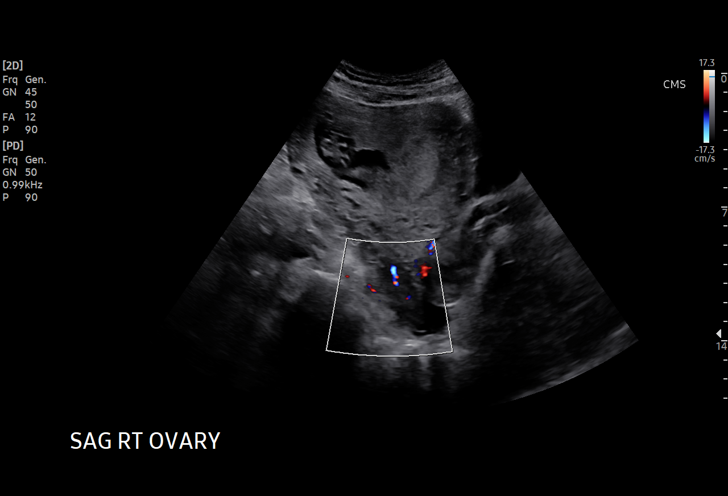
[im 20/29]
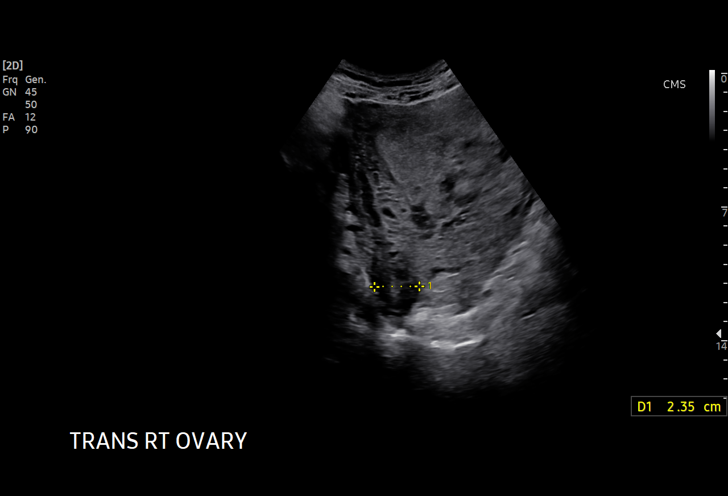
[im 22/29]
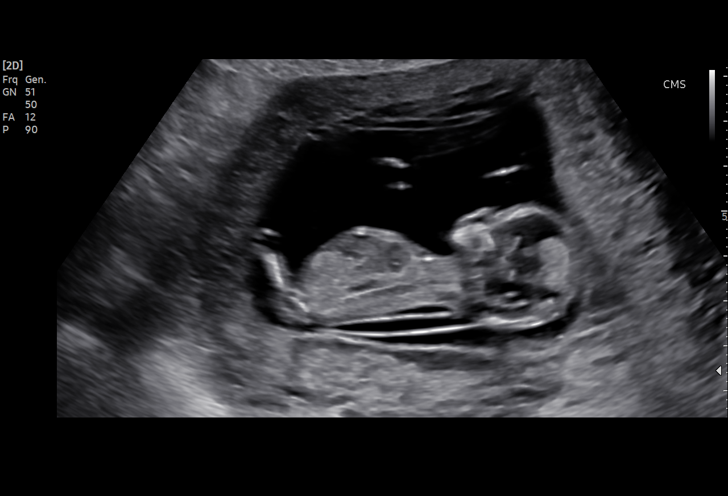
[im 24/29]
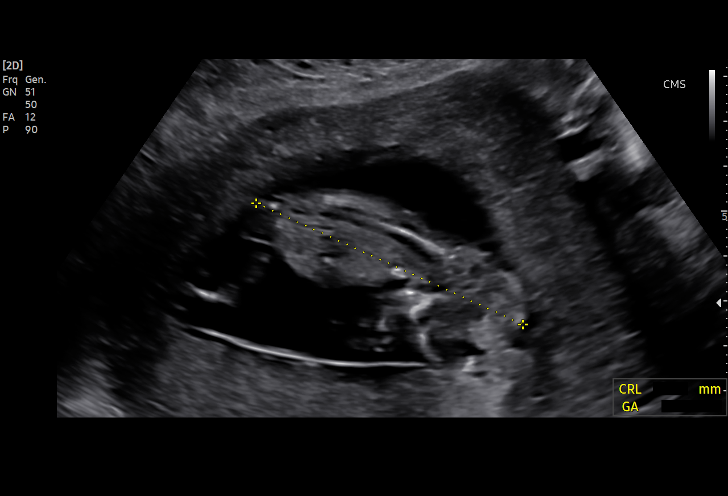
[im 26/29]
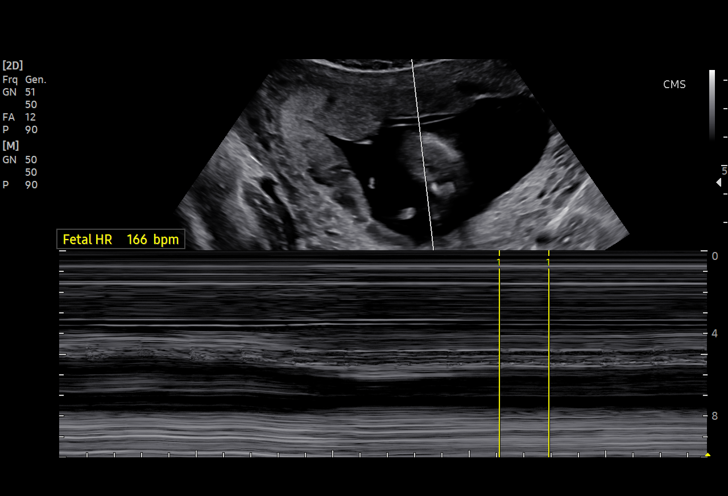
[im 29/29]
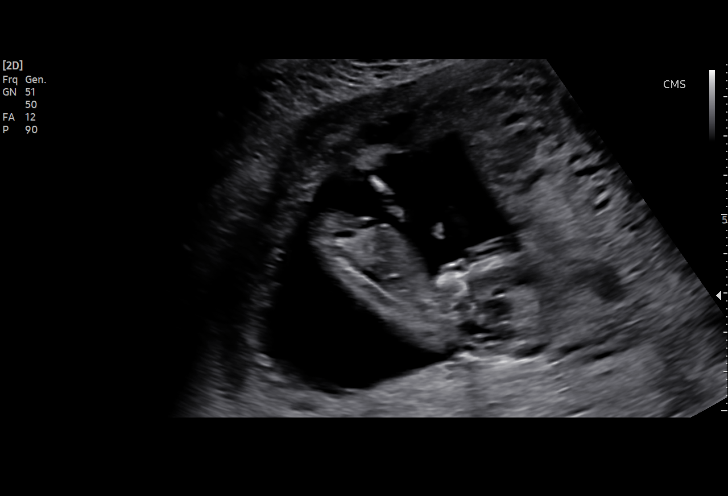

[15 of 28 positions shown; findings below may reference images not displayed]

FINDINGS: Intrauterine gestational sac: Single

Yolk sac:  Visualized

Embryo:  Visualized

Cardiac Activity: Visualized

Heart Rate: 166 bpm

CRL:   66.7 mm   13 w 0 d                  US EDC: 10/08/2021

Subchorionic hemorrhage:  None visualized.

Maternal uterus/adnexae: No adnexal mass. Normal bilateral ovaries.
No pelvic free fluid.
IMPRESSION: Single live intrauterine pregnancy dating 13 week 0 day by current
ultrasound.

## 2023-02-10 ENCOUNTER — Other Ambulatory Visit: Payer: Self-pay | Admitting: Family Medicine

## 2023-02-10 DIAGNOSIS — Z9889 Other specified postprocedural states: Secondary | ICD-10-CM | POA: Insufficient documentation

## 2023-02-10 NOTE — Progress Notes (Signed)
 Patient messaged through daughter's chart that she is having intense pain with protruding screws from a orthopedic operation she had in 2014. Referral placed to Ortho to evaluate.

## 2023-02-22 IMAGING — US US MFM OB COMP +14 WKS
1 series · 13 of 28 positions shown · non-contrast
Comparison: none

[Series 1: us mfm ob comp +14 wks · 96 acquisitions, 13 frames shown]
[im 4/96]
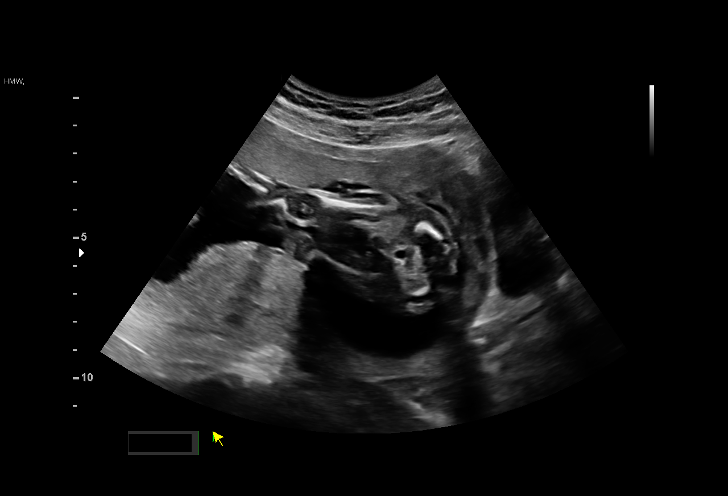
[im 11/96]
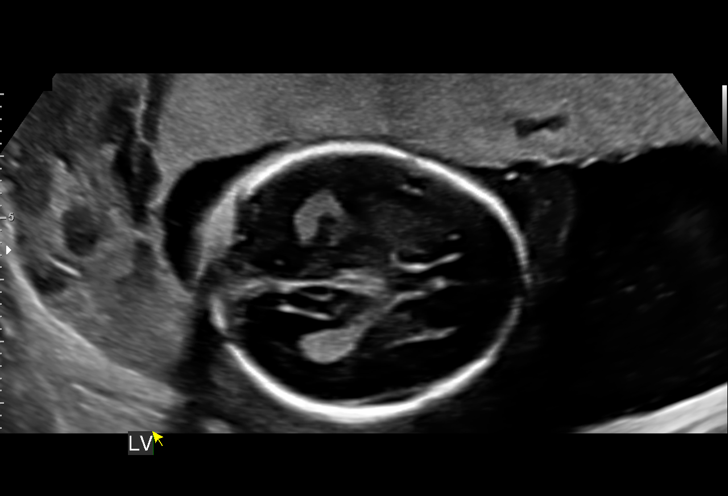
[im 18/96]
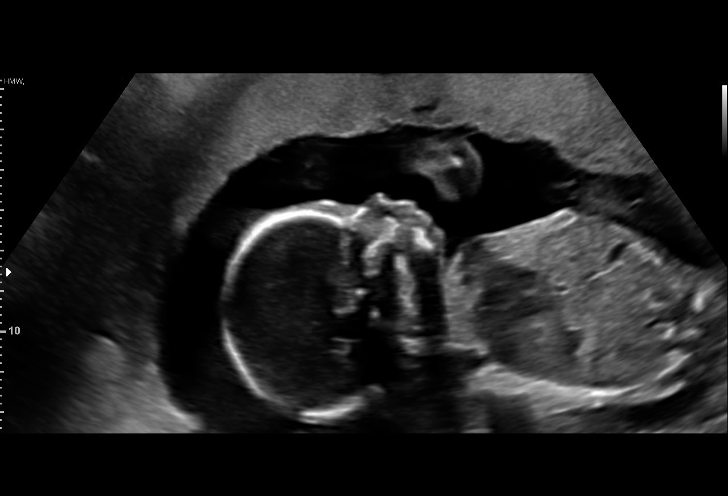
[im 25/96]
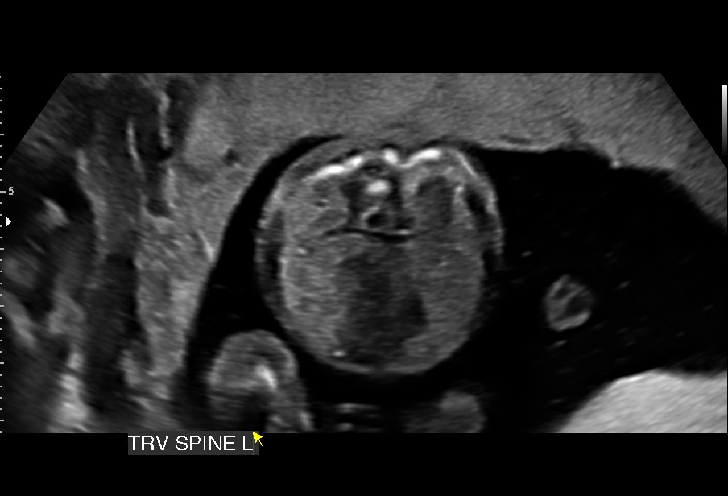
[im 32/96]
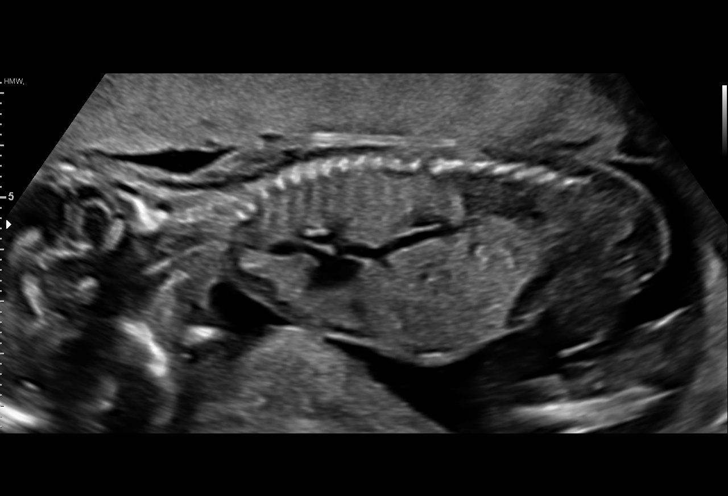
[im 39/96]
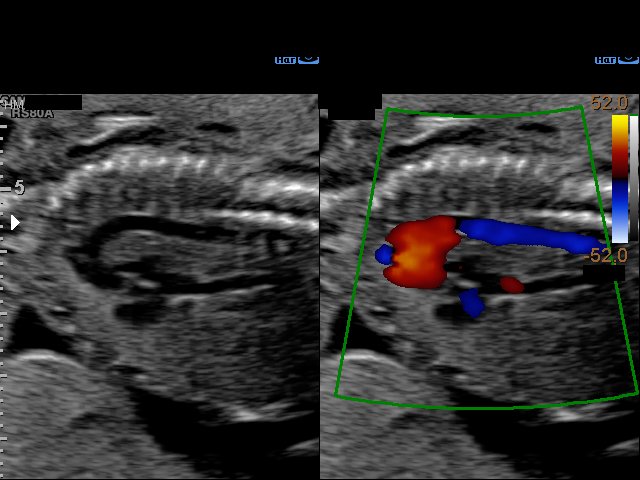
[im 50/96]
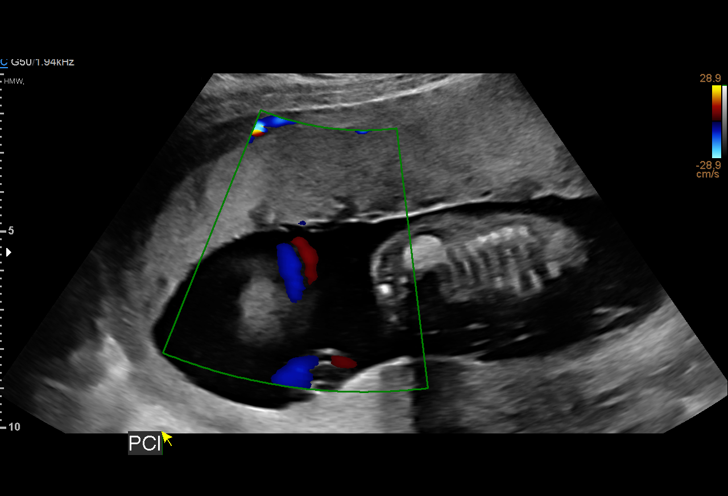
[im 57/96]
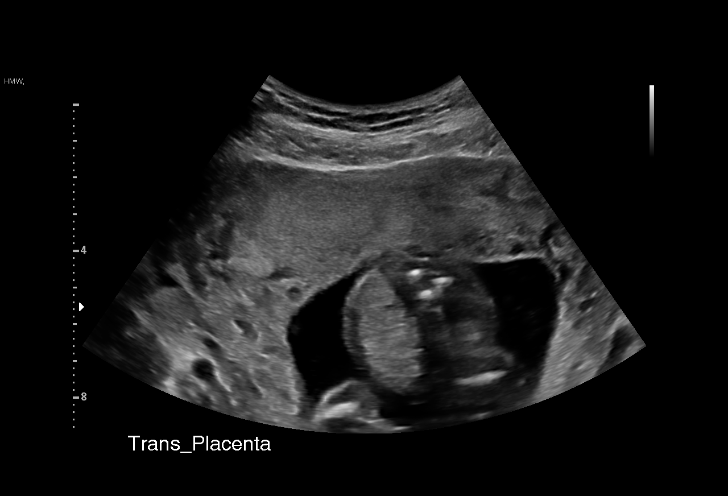
[im 64/96]
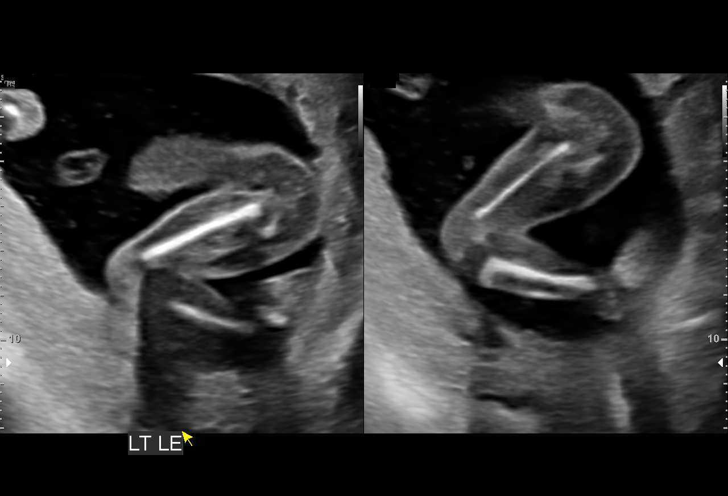
[im 71/96]
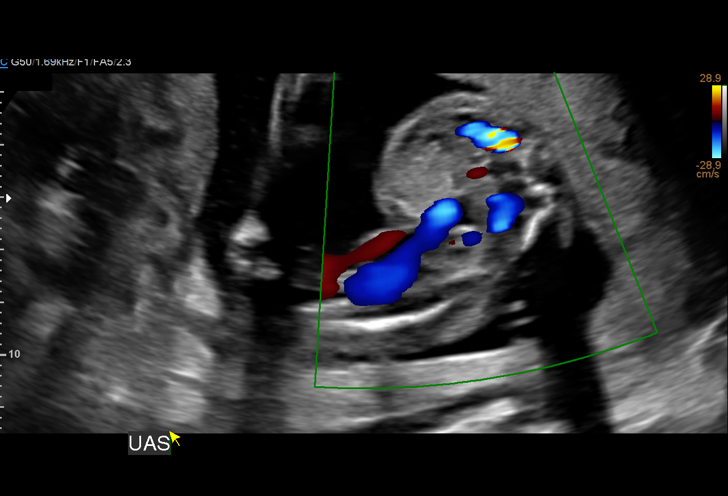
[im 78/96]
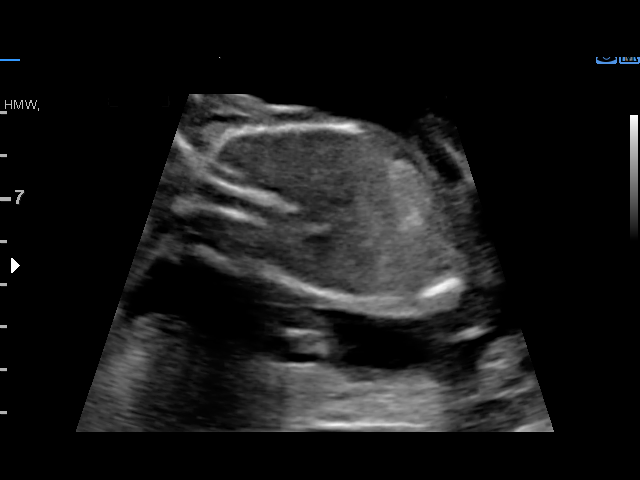
[im 85/96]
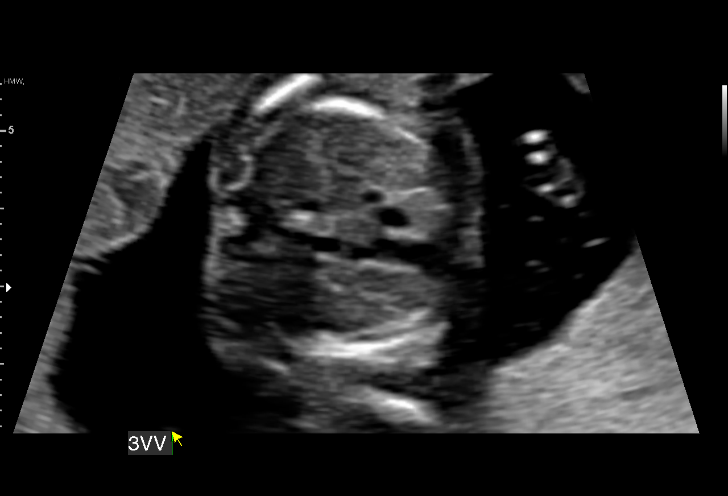
[im 92/96]
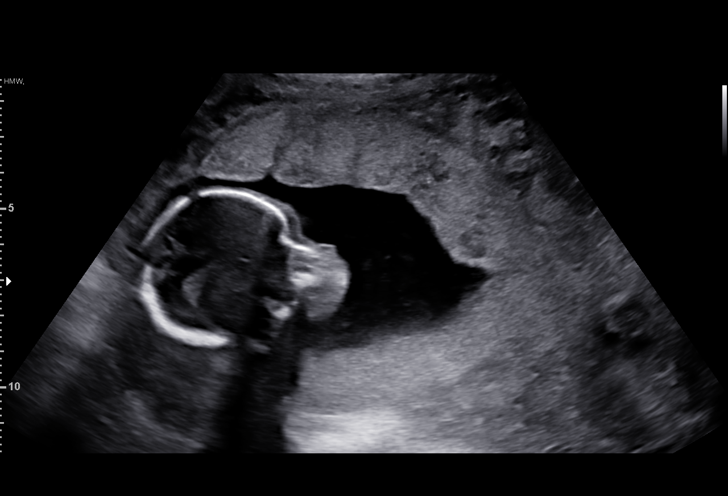

[13 of 28 positions shown; findings below may reference images not displayed]

1  US MFM OB COMP + 14 WK                76805.01    DIPAK GASCON

Indications

 Pregnancy resulting from assisted
 reproductive technology (donor sperm)
 19 weeks gestation of pregnancy
 LR NIPS, 9.0 FF, Neg Horizon
Fetal Evaluation

 Num Of Fetuses:         1
 Fetal Heart Rate(bpm):  147
 Cardiac Activity:       Observed
 Presentation:           Breech
 Placenta:               Anterior
 P. Cord Insertion:      Visualized, central

 Amniotic Fluid
 AFI FV:      Within normal limits

                             Largest Pocket(cm)

Biometry

 BPD:      43.2  mm     G. Age:  19w 0d         54  %    CI:        82.33   %    70 - 86
                                                         FL/HC:      18.7   %    16.1 -
 HC:      150.2  mm     G. Age:  18w 1d          8  %    HC/AC:      1.03        1.09 -
 AC:      145.6  mm     G. Age:  19w 6d         74  %    FL/BPD:     65.0   %
 FL:       28.1  mm     G. Age:  18w 4d         29  %    FL/AC:      19.3   %    20 - 24
 HUM:      27.3  mm     G. Age:  18w 5d         42  %
 CER:      18.7  mm     G. Age:  18w 3d         12  %
 NFT:       1.7  mm
 CM:        2.8  mm

 Est. FW:     277  gm    0 lb 10 oz      55  %
OB History

 Gravidity:    6         Term:   0        Prem:   0        SAB:   5
 TOP:          0       Ectopic:  0        Living: 0
Gestational Age

 U/S Today:     18w 6d                                        EDD:   10/09/21
 Best:          19w 0d     Det. By:  Early Ultrasound         EDD:   10/08/21
                                     (04/02/21)
Anatomy

 Cranium:               Appears normal         LVOT:                   Not well visualized
 Cavum:                 Appears normal         Aortic Arch:            Appears normal
 Ventricles:            Appears normal         Ductal Arch:            Appears normal
 Choroid Plexus:        Appears normal         Diaphragm:              Appears normal
 Cerebellum:            Appears normal         Stomach:                Appears normal, left
                                                                       sided
 Posterior Fossa:       Appears normal         Abdomen:                Appears normal
 Nuchal Fold:           Appears normal         Abdominal Wall:         Appears nml (cord
                                                                       insert, abd wall)
 Face:                  Appears normal         Cord Vessels:           Appears normal (3
                        (orbits and profile)                           vessel cord)
 Lips:                  Appears normal         Kidneys:                Appear normal
 Palate:                Not well visualized    Bladder:                Appears normal
 Thoracic:              Appears normal         Spine:                  Appears normal
 Heart:                 Not well visualized    Upper Extremities:      Appears normal
 RVOT:                  Not well visualized    Lower Extremities:      Appears normal

 Other:  Heels and 5th digit visualized. Nasal bone visualized. Technically
         difficult due to fetal position.
Cervix Uterus Adnexa

 Cervix
 Length:            3.2  cm.
 Closed

 Uterus
 No abnormality visualized.

 Right Ovary
 No adnexal mass visualized.

 Left Ovary
 No adnexal mass visualized.

 Cul De Sac
 No free fluid seen.

 Adnexa
 No abnormality visualized.
Comments

 This patient was seen for a detailed fetal anatomy scan.
 She denies any significant past medical history and denies
 any problems in her current pregnancy.
 She had a cell free DNA test earlier in her pregnancy which
 indicated a low risk for trisomy 21, 18, and 13.  The patient
 did not want the fetal gender revealed today.
 She was informed that the fetal growth and amniotic fluid
 level were appropriate for her gestational age.
 There were no obvious fetal anomalies noted on today's
 ultrasound exam.  However, the views of the fetal anatomy
 were limited today due to the fetal position.
 The patient was informed that anomalies may be missed due
 to technical limitations. If the fetus is in a suboptimal position
 or maternal habitus is increased, visualization of the fetus in
 the maternal uterus may be impaired.
 A follow-up exam was scheduled in 4 weeks to complete the
 views of the fetal anatomy.

## 2023-04-06 ENCOUNTER — Emergency Department (HOSPITAL_COMMUNITY): Admission: EM | Admit: 2023-04-06 | Discharge: 2023-04-07 | Disposition: A | Discharge status: Home / Self Care

## 2023-04-06 ENCOUNTER — Encounter (HOSPITAL_COMMUNITY): Payer: Self-pay | Admitting: Emergency Medicine

## 2023-04-06 ENCOUNTER — Other Ambulatory Visit: Payer: Self-pay

## 2023-04-06 DIAGNOSIS — J4521 Mild intermittent asthma with (acute) exacerbation: Secondary | ICD-10-CM | POA: Diagnosis not present

## 2023-04-06 DIAGNOSIS — R0602 Shortness of breath: Secondary | ICD-10-CM | POA: Diagnosis not present

## 2023-04-06 MED ORDER — IPRATROPIUM-ALBUTEROL 0.5-2.5 (3) MG/3ML IN SOLN
3.0000 mL | Freq: Once | RESPIRATORY_TRACT | Status: AC
  Administered 2023-04-06: 3 mL via RESPIRATORY_TRACT
  Filled 2023-04-06: qty 6

## 2023-04-06 MED ORDER — IPRATROPIUM-ALBUTEROL 0.5-2.5 (3) MG/3ML IN SOLN
3.0000 mL | Freq: Once | RESPIRATORY_TRACT | Status: AC
  Administered 2023-04-06: 3 mL via RESPIRATORY_TRACT
  Filled 2023-04-06: qty 3

## 2023-04-06 NOTE — ED Triage Notes (Signed)
 Pt reports asthma attack that became worse today, started last night. Was unable to pick up meds from pharmacy. Requesting breathing treatment. Expiratory wheezing audible in triage. Hx of asthma.

## 2023-04-07 MED ORDER — IPRATROPIUM BROMIDE 0.02 % IN SOLN
0.5000 mg | Freq: Once | RESPIRATORY_TRACT | Status: AC
  Administered 2023-04-07: 0.5 mg via RESPIRATORY_TRACT
  Filled 2023-04-07: qty 2.5

## 2023-04-07 MED ORDER — ALBUTEROL SULFATE (2.5 MG/3ML) 0.083% IN NEBU: 2.5000 mg | INHALATION_SOLUTION | Freq: Four times a day (QID) | RESPIRATORY_TRACT | 0 refills | Status: DC | PRN

## 2023-04-07 MED ORDER — ALBUTEROL SULFATE (2.5 MG/3ML) 0.083% IN NEBU
5.0000 mg | INHALATION_SOLUTION | Freq: Once | RESPIRATORY_TRACT | Status: AC
  Administered 2023-04-07: 5 mg via RESPIRATORY_TRACT
  Filled 2023-04-07: qty 6

## 2023-04-07 MED ORDER — ALBUTEROL SULFATE HFA 108 (90 BASE) MCG/ACT IN AERS: 1.0000 | INHALATION_SPRAY | Freq: Four times a day (QID) | RESPIRATORY_TRACT | 0 refills | Status: DC | PRN

## 2023-04-07 MED ORDER — DEXAMETHASONE SODIUM PHOSPHATE 10 MG/ML IJ SOLN
10.0000 mg | Freq: Once | INTRAMUSCULAR | Status: AC
  Administered 2023-04-07: 10 mg via INTRAMUSCULAR
  Filled 2023-04-07: qty 1

## 2023-04-07 NOTE — ED Provider Notes (Signed)
 Michelle [name]   CSN: [id number] Arrival date & time: 04/06/23  2157     History  Chief Complaint  Patient presents with   Asthma    Michelle [name] is a 33 y.o. female.  The history is provided by the patient and medical records.  Asthma Associated symptoms include shortness of breath.   33 y.o. F with hx of asthma, presenting to the ED with shortness of breath.  States she has been having trouble over the past few days.  Using her home nebs and inhaler recently.  Ran out of her home neb solution today, tried to pick up refills at the pharmacy but was told they needed verification from her PCP first.  States she tried to go home and use her rescue inhaler but was not having any significant relief and was unable to get comfortable to sleep.  She was given 2 neb treatments in triage with some improvement but breathing still does not feel quite normal.  She has not had any fever or chills recently.  No sick contacts.  Home Medications Prior to Admission medications   Medication Sig Start Date End Date Taking? Authorizing Provider  acetaminophen (TYLENOL) 325 MG tablet Take 2 tablets (650 mg total) by mouth every 6 (six) hours as needed. 10/02/21   Venora Maples, MD  albuterol (PROAIR HFA) 108 (90 Base) MCG/ACT inhaler Inhale 1-2 puffs into the lungs every 6 (six) hours as needed for wheezing or shortness of breath. 01/06/23   Federico Flake, MD  albuterol (PROVENTIL) (2.5 MG/3ML) 0.083% nebulizer solution Take 3 mLs (2.5 mg total) by nebulization every 6 (six) hours as needed for wheezing or shortness of breath. 01/06/23   Federico Flake, MD  albuterol (VENTOLIN HFA) 108 (90 Base) MCG/ACT inhaler Inhale 2 puffs into the lungs every 4 (four) hours as needed for wheezing or shortness of breath. 01/06/23   Federico Flake, MD  fluticasone (FLONASE) 50 MCG/ACT nasal spray Place 2 sprays into both nostrils daily.  11/29/21   Venora Maples, MD  fluticasone (FLOVENT HFA) 110 MCG/ACT inhaler Inhale 1 puff into the lungs daily. 11/03/22   Federico Flake, MD  ibuprofen (ADVIL) 600 MG tablet Take 1 tablet (600 mg total) by mouth every 6 (six) hours. 07/01/22   Venora Maples, MD  Prenatal Vit-Fe Fumarate-FA (PRENATAL VITAMIN PO) Take 1 tablet by mouth daily.    [provider]  valACYclovir (VALTREX) 500 MG tablet Take 1 tablet (500 mg total) by mouth 2 (two) times daily. 10/02/21   Venora Maples, MD      Allergies    Patient has no known allergies.    Review of Systems   Review of Systems  Respiratory:  Positive for shortness of breath and wheezing.   All other systems reviewed and are negative.   Physical Exam Updated Vital Signs BP (!) 153/129   Pulse 80   Temp 98.5 F (36.9 C) (Oral)   Resp 18   Ht 5\' 3"  (1.6 m)   Wt 61.7 kg   LMP 04/06/2023   SpO2 100%   BMI 24.09 kg/m  Physical Exam Vitals and nursing [name] reviewed.  Constitutional:      Appearance: She is well-developed.  HENT:     Head: Normocephalic and atraumatic.  Eyes:     Conjunctiva/sclera: Conjunctivae normal.     Pupils: Pupils are equal, round, and reactive to light.  Cardiovascular:  Rate and Rhythm: Normal rate and regular rhythm.     Heart sounds: Normal heart sounds.  Pulmonary:     Effort: Pulmonary effort is normal.     Breath sounds: Wheezing present.     Comments: Expiratory wheezes on exam but able to speak in full sentences without difficulty Abdominal:     General: Bowel sounds are normal.     Palpations: Abdomen is soft.  Musculoskeletal:        General: Normal range of motion.     Cervical back: Normal range of motion.  Skin:    General: Skin is warm and dry.  Neurological:     Mental Status: She is alert and oriented to person, place, and time.     ED Results / Procedures / Treatments   Labs (all labs ordered are listed, but only abnormal results are  displayed) Labs Reviewed - No data to display  EKG None  Radiology No results found.  Procedures Procedures    CRITICAL CARE Performed by: Garlon Hatchet   Total critical care time: 35 minutes  Critical care time was exclusive of separately billable procedures and treating other patients.  Critical care was necessary to treat or prevent imminent or life-threatening deterioration.  Critical care was time spent personally by me on the following activities: development of treatment plan with patient and/or surrogate as well as nursing, discussions with consultants, evaluation of patient's response to treatment, examination of patient, obtaining history from patient or surrogate, ordering and performing treatments and interventions, ordering and review of laboratory studies, ordering and review of radiographic studies, pulse oximetry and re-evaluation of patient's condition.   Medications Ordered in ED Medications  ipratropium-albuterol (DUONEB) 0.5-2.5 (3) MG/3ML nebulizer solution 3 mL (3 mLs Nebulization Given 04/06/23 2251)  ipratropium-albuterol (DUONEB) 0.5-2.5 (3) MG/3ML nebulizer solution 3 mL (3 mLs Nebulization Given 04/06/23 2332)  dexamethasone (DECADRON) injection 10 mg (10 mg Intramuscular Given 04/07/23 0055)  albuterol (PROVENTIL) (2.5 MG/3ML) 0.083% nebulizer solution 5 mg (5 mg Nebulization Given 04/07/23 0056)  ipratropium (ATROVENT) nebulizer solution 0.5 mg (0.5 mg Nebulization Given 04/07/23 0056)    ED Course/ Medical Decision Making/ A&P                                 Medical Decision Making Risk Prescription drug management.   33 year old female presenting to the ED with agonal exacerbation.  Has ran out of home neb solution, pharmacy unable to authorize refill.  She is afebrile and nontoxic on my exam.  She does have some expiratory wheezes but is in no acute distress, able to speak in full sentences without difficulty.  Given series of nebs x 3 along  with IM Decadron with good improvement.  Remains hemodynamically stable.  She does have a little bit of tachycardia but I suspect this is due to the albuterol.  Feel she is stable for discharge.  I have sent in refills of her home neb solution as well as rescue inhaler.  She was encouraged to follow-up closely with PCP.  Return here for new concerns.  Final Clinical Impression(s) / ED Diagnoses Final diagnoses:  Mild intermittent asthma with exacerbation    Rx / DC Orders ED Discharge Orders     None         Garlon Hatchet, PA-C 04/07/23 0158    Durwin Glaze, MD 04/07/23 [phone]

## 2023-04-07 NOTE — Discharge Instructions (Signed)
Take the prescribed medication as directed. Follow-up with your doctor. Return to the ED for new or worsening symptoms. 

## 2023-04-09 MED ORDER — ALBUTEROL SULFATE HFA 108 (90 BASE) MCG/ACT IN AERS: 1.0000 | INHALATION_SPRAY | Freq: Four times a day (QID) | RESPIRATORY_TRACT | 2 refills | Status: DC | PRN

## 2023-04-09 MED ORDER — ALBUTEROL SULFATE (2.5 MG/3ML) 0.083% IN NEBU: 2.5000 mg | INHALATION_SOLUTION | Freq: Four times a day (QID) | RESPIRATORY_TRACT | 2 refills | Status: DC | PRN

## 2023-04-27 ENCOUNTER — Telehealth

## 2023-04-28 ENCOUNTER — Telehealth

## 2023-04-28 DIAGNOSIS — R070 Pain in throat: Secondary | ICD-10-CM | POA: Diagnosis not present

## 2023-04-28 DIAGNOSIS — J020 Streptococcal pharyngitis: Secondary | ICD-10-CM | POA: Diagnosis not present

## 2023-10-02 ENCOUNTER — Other Ambulatory Visit: Payer: Self-pay | Admitting: Family Medicine

## 2023-10-02 DIAGNOSIS — J4520 Mild intermittent asthma, uncomplicated: Secondary | ICD-10-CM

## 2023-10-02 DIAGNOSIS — M545 Low back pain, unspecified: Secondary | ICD-10-CM

## 2023-10-02 DIAGNOSIS — J4521 Mild intermittent asthma with (acute) exacerbation: Secondary | ICD-10-CM

## 2023-10-02 DIAGNOSIS — T7840XA Allergy, unspecified, initial encounter: Secondary | ICD-10-CM

## 2023-10-02 MED ORDER — TRIAMCINOLONE ACETONIDE 0.5 % EX CREA: 1.0000 | TOPICAL_CREAM | Freq: Three times a day (TID) | CUTANEOUS | 0 refills | Status: AC

## 2023-10-02 MED ORDER — ALBUTEROL SULFATE (2.5 MG/3ML) 0.083% IN NEBU: 2.5000 mg | INHALATION_SOLUTION | Freq: Four times a day (QID) | RESPIRATORY_TRACT | 2 refills | Status: AC | PRN

## 2023-10-02 MED ORDER — ALBUTEROL SULFATE HFA 108 (90 BASE) MCG/ACT IN AERS: 1.0000 | INHALATION_SPRAY | Freq: Four times a day (QID) | RESPIRATORY_TRACT | 2 refills | Status: AC | PRN

## 2023-10-02 MED ORDER — CYCLOBENZAPRINE HCL 10 MG PO TABS: 10.0000 mg | ORAL_TABLET | Freq: Three times a day (TID) | ORAL | 1 refills | Status: AC | PRN

## 2023-10-02 MED ORDER — FLUTICASONE PROPIONATE HFA 110 MCG/ACT IN AERO: 1.0000 | INHALATION_SPRAY | Freq: Every day | RESPIRATORY_TRACT | 12 refills | Status: AC

## 2023-10-02 MED ORDER — FLUTICASONE PROPIONATE 50 MCG/ACT NA SUSP: 2.0000 | Freq: Every day | NASAL | 11 refills | Status: AC

## 2023-10-02 NOTE — Progress Notes (Unsigned)
 Seen briefly, needs refill for meds Also having dry itchy bumps on arms not responding to vaseline or hydrocortisone , trial kenalog  Having some back pain, has been going on for months. No numbness, tingling, or weakness on exam, tender lumbar paraspinals. Trial flexeril  and she will find local PT.
